# Patient Record
Sex: Male | Born: 1980 | Race: White | Hispanic: No | Marital: Single | Smoking: Current every day smoker
Health system: Southern US, Community
[De-identification: ages and names within clinical notes are randomized; demographics above are authoritative.]

## PROBLEM LIST (undated history)

## (undated) DIAGNOSIS — F4310 Post-traumatic stress disorder, unspecified: Secondary | ICD-10-CM

## (undated) DIAGNOSIS — F988 Other specified behavioral and emotional disorders with onset usually occurring in childhood and adolescence: Secondary | ICD-10-CM

## (undated) DIAGNOSIS — F419 Anxiety disorder, unspecified: Secondary | ICD-10-CM

## (undated) DIAGNOSIS — I10 Essential (primary) hypertension: Secondary | ICD-10-CM

## (undated) DIAGNOSIS — N209 Urinary calculus, unspecified: Secondary | ICD-10-CM

## (undated) HISTORY — PX: LITHOTRIPSY: SUR834

## (undated) HISTORY — PX: OTHER SURGICAL HISTORY: SHX169

---

## 2009-06-21 ENCOUNTER — Emergency Department (HOSPITAL_COMMUNITY): Admission: EM | Admit: 2009-06-21 | Discharge: 2009-06-21 | Payer: Self-pay | Admitting: Emergency Medicine

## 2010-04-24 LAB — BASIC METABOLIC PANEL
BUN: 12 mg/dL (ref 6–23)
Creatinine, Ser: 0.94 mg/dL (ref 0.4–1.5)
Potassium: 4 mEq/L (ref 3.5–5.1)
Sodium: 138 mEq/L (ref 135–145)

## 2010-04-24 LAB — DIFFERENTIAL
Basophils Absolute: 0 10*3/uL (ref 0.0–0.1)
Monocytes Relative: 9 % (ref 3–12)

## 2010-04-24 LAB — CBC
Hemoglobin: 14.5 g/dL (ref 13.0–17.0)
MCV: 95.4 fL (ref 78.0–100.0)
RBC: 4.25 MIL/uL (ref 4.22–5.81)
WBC: 10.3 10*3/uL (ref 4.0–10.5)

## 2010-07-21 ENCOUNTER — Emergency Department (HOSPITAL_COMMUNITY)
Admission: EM | Admit: 2010-07-21 | Discharge: 2010-07-21 | Disposition: A | Discharge status: Home / Self Care | Payer: BC Managed Care – PPO | Attending: Emergency Medicine | Admitting: Emergency Medicine

## 2010-07-21 ENCOUNTER — Emergency Department (HOSPITAL_COMMUNITY): Payer: BC Managed Care – PPO

## 2010-07-21 DIAGNOSIS — I10 Essential (primary) hypertension: Secondary | ICD-10-CM | POA: Insufficient documentation

## 2010-07-21 DIAGNOSIS — R209 Unspecified disturbances of skin sensation: Secondary | ICD-10-CM | POA: Insufficient documentation

## 2010-07-21 DIAGNOSIS — M545 Low back pain, unspecified: Secondary | ICD-10-CM | POA: Insufficient documentation

## 2010-07-21 DIAGNOSIS — M79609 Pain in unspecified limb: Secondary | ICD-10-CM | POA: Insufficient documentation

## 2010-07-21 DIAGNOSIS — R29898 Other symptoms and signs involving the musculoskeletal system: Secondary | ICD-10-CM | POA: Insufficient documentation

## 2011-12-10 ENCOUNTER — Emergency Department (HOSPITAL_COMMUNITY)
Admission: EM | Admit: 2011-12-10 | Discharge: 2011-12-10 | Disposition: A | Discharge status: Home / Self Care | Attending: Emergency Medicine | Admitting: Emergency Medicine

## 2011-12-10 ENCOUNTER — Encounter (HOSPITAL_COMMUNITY): Payer: Self-pay | Admitting: Emergency Medicine

## 2011-12-10 DIAGNOSIS — Z79899 Other long term (current) drug therapy: Secondary | ICD-10-CM | POA: Insufficient documentation

## 2011-12-10 DIAGNOSIS — R51 Headache: Secondary | ICD-10-CM | POA: Insufficient documentation

## 2011-12-10 DIAGNOSIS — F17200 Nicotine dependence, unspecified, uncomplicated: Secondary | ICD-10-CM | POA: Insufficient documentation

## 2011-12-10 DIAGNOSIS — J029 Acute pharyngitis, unspecified: Secondary | ICD-10-CM | POA: Insufficient documentation

## 2011-12-10 DIAGNOSIS — Z8719 Personal history of other diseases of the digestive system: Secondary | ICD-10-CM | POA: Insufficient documentation

## 2011-12-10 DIAGNOSIS — IMO0001 Reserved for inherently not codable concepts without codable children: Secondary | ICD-10-CM | POA: Insufficient documentation

## 2011-12-10 LAB — RAPID STREP SCREEN (MED CTR MEBANE ONLY): Streptococcus, Group A Screen (Direct): POSITIVE — AB

## 2011-12-10 MED ORDER — CLINDAMYCIN HCL 150 MG PO CAPS: 150.0000 mg | ORAL_CAPSULE | Freq: Three times a day (TID) | ORAL | Status: DC

## 2011-12-10 MED ORDER — MELOXICAM 7.5 MG PO TABS: ORAL_TABLET | ORAL | Status: DC

## 2011-12-10 MED ORDER — HYDROCODONE-ACETAMINOPHEN 5-325 MG PO TABS: ORAL_TABLET | ORAL | Status: DC

## 2011-12-10 MED ORDER — HYDROCODONE-ACETAMINOPHEN 5-325 MG PO TABS
ORAL_TABLET | ORAL | Status: AC
  Administered 2011-12-10: 2
  Filled 2011-12-10: qty 2
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
## 2011-12-10 NOTE — ED Notes (Signed)
Pt c/o sore throat x 2-3 days. Exposed to strep throat Friday.

## 2011-12-10 NOTE — ED Notes (Signed)
Sick for 3 days, throat and tonsils red with swelling of tonsils.  Strep screen sent to lab.

## 2011-12-11 NOTE — ED Provider Notes (Signed)
Medical screening examination/treatment/procedure(s) were performed by non-physician practitioner and as supervising physician I was immediately available for consultation/collaboration.   Benny Lennert, MD 12/11/11 [phone]

## 2012-02-25 ENCOUNTER — Emergency Department (HOSPITAL_COMMUNITY)

## 2012-02-25 ENCOUNTER — Encounter (HOSPITAL_COMMUNITY): Payer: Self-pay | Admitting: Emergency Medicine

## 2012-02-25 ENCOUNTER — Emergency Department (HOSPITAL_COMMUNITY)
Admission: EM | Admit: 2012-02-25 | Discharge: 2012-02-25 | Disposition: A | Discharge status: Home / Self Care | Attending: Emergency Medicine | Admitting: Emergency Medicine

## 2012-02-25 DIAGNOSIS — R002 Palpitations: Secondary | ICD-10-CM | POA: Insufficient documentation

## 2012-02-25 DIAGNOSIS — R209 Unspecified disturbances of skin sensation: Secondary | ICD-10-CM | POA: Insufficient documentation

## 2012-02-25 DIAGNOSIS — Z79899 Other long term (current) drug therapy: Secondary | ICD-10-CM | POA: Insufficient documentation

## 2012-02-25 DIAGNOSIS — J3489 Other specified disorders of nose and nasal sinuses: Secondary | ICD-10-CM | POA: Insufficient documentation

## 2012-02-25 DIAGNOSIS — R079 Chest pain, unspecified: Secondary | ICD-10-CM | POA: Insufficient documentation

## 2012-02-25 DIAGNOSIS — I129 Hypertensive chronic kidney disease with stage 1 through stage 4 chronic kidney disease, or unspecified chronic kidney disease: Secondary | ICD-10-CM | POA: Insufficient documentation

## 2012-02-25 DIAGNOSIS — R630 Anorexia: Secondary | ICD-10-CM | POA: Insufficient documentation

## 2012-02-25 DIAGNOSIS — F411 Generalized anxiety disorder: Secondary | ICD-10-CM | POA: Insufficient documentation

## 2012-02-25 DIAGNOSIS — F419 Anxiety disorder, unspecified: Secondary | ICD-10-CM

## 2012-02-25 DIAGNOSIS — H538 Other visual disturbances: Secondary | ICD-10-CM | POA: Insufficient documentation

## 2012-02-25 DIAGNOSIS — R61 Generalized hyperhidrosis: Secondary | ICD-10-CM | POA: Insufficient documentation

## 2012-02-25 DIAGNOSIS — F17200 Nicotine dependence, unspecified, uncomplicated: Secondary | ICD-10-CM | POA: Insufficient documentation

## 2012-02-25 DIAGNOSIS — R05 Cough: Secondary | ICD-10-CM | POA: Insufficient documentation

## 2012-02-25 DIAGNOSIS — R059 Cough, unspecified: Secondary | ICD-10-CM | POA: Insufficient documentation

## 2012-02-25 DIAGNOSIS — N289 Disorder of kidney and ureter, unspecified: Secondary | ICD-10-CM | POA: Insufficient documentation

## 2012-02-25 DIAGNOSIS — R112 Nausea with vomiting, unspecified: Secondary | ICD-10-CM | POA: Insufficient documentation

## 2012-02-25 DIAGNOSIS — R55 Syncope and collapse: Secondary | ICD-10-CM | POA: Insufficient documentation

## 2012-02-25 HISTORY — DX: Essential (primary) hypertension: I10

## 2012-02-25 LAB — CBC
HCT: 42.4 % (ref 39.0–52.0)
MCH: 33 pg (ref 26.0–34.0)
MCHC: 35.4 g/dL (ref 30.0–36.0)
MCV: 93.4 fL (ref 78.0–100.0)
Platelets: 287 10*3/uL (ref 150–400)
RBC: 4.54 MIL/uL (ref 4.22–5.81)
RDW: 13 % (ref 11.5–15.5)

## 2012-02-25 LAB — TROPONIN I: Troponin I: <0.3 ng/mL (ref <0.30)

## 2012-02-25 LAB — COMPREHENSIVE METABOLIC PANEL
BUN: 9 mg/dL (ref 6–23)
Calcium: 9.9 mg/dL (ref 8.4–10.5)
GFR calc non Af Amer: >90 mL/min (ref >90)
Glucose, Bld: 99 mg/dL (ref 70–99)
Potassium: 3.4 mEq/L — ABNORMAL LOW (ref 3.5–5.1)
Sodium: 138 mEq/L (ref 135–145)
Total Bilirubin: 0.9 mg/dL (ref 0.3–1.2)

## 2012-02-25 LAB — RAPID URINE DRUG SCREEN, HOSP PERFORMED
Amphetamines: NOT DETECTED
Benzodiazepines: NOT DETECTED
Cocaine: NOT DETECTED
Opiates: POSITIVE — AB
Tetrahydrocannabinol: NOT DETECTED

## 2012-02-25 LAB — URINALYSIS, ROUTINE W REFLEX MICROSCOPIC
Glucose, UA: NEGATIVE mg/dL
Hgb urine dipstick: NEGATIVE
Leukocytes, UA: NEGATIVE
Specific Gravity, Urine: 1.015 (ref 1.005–1.030)
pH: 8.5 — ABNORMAL HIGH (ref 5.0–8.0)

## 2012-02-25 LAB — D-DIMER, QUANTITATIVE: D-Dimer, Quant: <0.27 ug/mL-FEU (ref 0.00–0.48)

## 2012-02-25 MED ORDER — HYDROCODONE-ACETAMINOPHEN 5-325 MG PO TABS: 1.0000 | ORAL_TABLET | Freq: Four times a day (QID) | ORAL | Status: AC | PRN

## 2012-02-25 MED ORDER — ONDANSETRON HCL 4 MG/2ML IJ SOLN
4.0000 mg | Freq: Once | INTRAMUSCULAR | Status: AC
  Administered 2012-02-25: 4 mg via INTRAVENOUS
  Filled 2012-02-25: qty 2

## 2012-02-25 MED ORDER — LORAZEPAM 1 MG PO TABS: 1.0000 mg | ORAL_TABLET | Freq: Three times a day (TID) | ORAL | Status: DC | PRN

## 2012-02-25 MED ORDER — LORAZEPAM 2 MG/ML IJ SOLN
1.0000 mg | Freq: Once | INTRAMUSCULAR | Status: AC
  Administered 2012-02-25: 1 mg via INTRAVENOUS
  Filled 2012-02-25: qty 1

## 2012-02-25 MED ORDER — SODIUM CHLORIDE 0.9 % IV BOLUS (SEPSIS)
1000.0000 mL | Freq: Once | INTRAVENOUS | Status: AC
  Administered 2012-02-25: 1000 mL via INTRAVENOUS

## 2012-02-25 MED ORDER — SODIUM CHLORIDE 0.9 % IV SOLN
INTRAVENOUS | Status: DC
  Administered 2012-02-25 (×2): 1000 mL via INTRAVENOUS

## 2012-02-25 MED ORDER — HYDROMORPHONE HCL PF 1 MG/ML IJ SOLN
1.0000 mg | Freq: Once | INTRAMUSCULAR | Status: AC
  Administered 2012-02-25: 1 mg via INTRAVENOUS
  Filled 2012-02-25: qty 1

## 2012-02-25 MED ORDER — MORPHINE SULFATE 2 MG/ML IJ SOLN
2.0000 mg | Freq: Once | INTRAMUSCULAR | Status: AC
  Administered 2012-02-25: 2 mg via INTRAVENOUS
  Filled 2012-02-25: qty 1

## 2012-02-25 MED ORDER — IOHEXOL 350 MG/ML SOLN
100.0000 mL | Freq: Once | INTRAVENOUS | Status: AC | PRN
  Administered 2012-02-25: 100 mL via INTRAVENOUS

## 2012-02-25 MED ORDER — ASPIRIN 81 MG PO CHEW
324.0000 mg | CHEWABLE_TABLET | Freq: Once | ORAL | Status: AC
  Administered 2012-02-25: 324 mg via ORAL
  Filled 2012-02-25: qty 4

## 2012-02-25 MED ORDER — NITROGLYCERIN 0.4 MG SL SUBL
0.4000 mg | SUBLINGUAL_TABLET | SUBLINGUAL | Status: DC | PRN
  Administered 2012-02-25 (×2): 0.4 mg via SUBLINGUAL
  Filled 2012-02-25: qty 25
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
## 2012-02-25 NOTE — ED Notes (Signed)
C/o left sided cp that began early this am along with vomiting. Pain radiates down left arm and c/o left arm numbness as well.

## 2013-04-01 ENCOUNTER — Ambulatory Visit (INDEPENDENT_AMBULATORY_CARE_PROVIDER_SITE_OTHER): Payer: 59 | Admitting: Family Medicine

## 2013-04-01 ENCOUNTER — Ambulatory Visit: Payer: 59

## 2013-04-01 VITALS — BP 116/72 | HR 99 | Temp 98.0°F | Resp 16

## 2013-04-01 DIAGNOSIS — S62308A Unspecified fracture of other metacarpal bone, initial encounter for closed fracture: Secondary | ICD-10-CM

## 2013-04-01 DIAGNOSIS — M79641 Pain in right hand: Secondary | ICD-10-CM

## 2013-04-01 DIAGNOSIS — M79609 Pain in unspecified limb: Secondary | ICD-10-CM

## 2013-04-01 NOTE — Patient Instructions (Signed)
See Dr. Mack Hookavid [facility]  Do not eat or drink anything until seen  You've declined putting on the splint here.Please be careful not to fall or hit it.

## 2013-04-01 NOTE — Progress Notes (Signed)
Subjective: 33 year old man slipped on some ice this morning and landed hard on his hand which was folded back under him. He got an abrasion of the third knuckle, but mostly it hurts across the whole hand. More laterally. No prior problems.  Objective: Very tender dorsum of the left hand from the second to the fifth better carpal regions, especially the more proximal fifth which is swollen. He has a small abrasion of the knuckle of the third finger. He says he has tingling in her finger tips. Minimal motion possible but it hurts too much to move much. He cannot make a fist. He is tender to the wrist.  [facility] reading (PRIMARY) by  Dr. Alwyn RenHopper Mildly displaced and moderately angulated closed fracture of right 5th metacarpal, proximal  Assessment: Hand pain Closed fracture right fifth metacarpal, proximal, probably angulated 30-40 and mildly displaced. A little bit difficult to visualize exactly..  Plan: [facility]

## 2014-03-16 ENCOUNTER — Encounter (HOSPITAL_COMMUNITY): Payer: Self-pay | Admitting: Emergency Medicine

## 2014-03-16 DIAGNOSIS — Z79899 Other long term (current) drug therapy: Secondary | ICD-10-CM | POA: Diagnosis not present

## 2014-03-16 DIAGNOSIS — Z87448 Personal history of other diseases of urinary system: Secondary | ICD-10-CM | POA: Insufficient documentation

## 2014-03-16 DIAGNOSIS — Z880 Allergy status to penicillin: Secondary | ICD-10-CM | POA: Diagnosis not present

## 2014-03-16 DIAGNOSIS — Z720 Tobacco use: Secondary | ICD-10-CM | POA: Insufficient documentation

## 2014-03-16 DIAGNOSIS — I10 Essential (primary) hypertension: Secondary | ICD-10-CM | POA: Insufficient documentation

## 2014-03-16 DIAGNOSIS — F419 Anxiety disorder, unspecified: Secondary | ICD-10-CM | POA: Insufficient documentation

## 2014-03-16 DIAGNOSIS — G4700 Insomnia, unspecified: Secondary | ICD-10-CM | POA: Diagnosis not present

## 2014-03-16 NOTE — ED Notes (Addendum)
Pt. reports anxiety , insomnia for several days , emotional stress  and visual hallucinations onset this week , denies suicidal ideation .

## 2014-03-17 ENCOUNTER — Emergency Department (HOSPITAL_COMMUNITY)
Admission: EM | Admit: 2014-03-17 | Discharge: 2014-03-17 | Disposition: A | Discharge status: Home / Self Care | Payer: Non-veteran care | Attending: Emergency Medicine | Admitting: Emergency Medicine

## 2014-03-17 DIAGNOSIS — G4700 Insomnia, unspecified: Secondary | ICD-10-CM

## 2014-03-17 DIAGNOSIS — F419 Anxiety disorder, unspecified: Secondary | ICD-10-CM

## 2014-03-17 HISTORY — DX: Anxiety disorder, unspecified: F41.9

## 2014-03-17 MED ORDER — LORAZEPAM 1 MG PO TABS
2.0000 mg | ORAL_TABLET | Freq: Once | ORAL | Status: AC
  Administered 2014-03-17: 2 mg via ORAL
  Filled 2014-03-17: qty 2

## 2014-03-17 NOTE — Discharge Instructions (Signed)
Ativan as prescribed as needed for anxiety.  Follow-up with your primary Dr. in the next week to be rechecked.   Insomnia Insomnia is frequent trouble falling and/or staying asleep. Insomnia can be a long term problem or a short term problem. Both are common. Insomnia can be a short term problem when the wakefulness is related to a certain stress or worry. Long term insomnia is often related to ongoing stress during waking hours and/or poor sleeping habits. Overtime, sleep deprivation itself can make the problem worse. Every little thing feels more severe because you are overtired and your ability to cope is decreased. CAUSES   Stress, anxiety, and depression.  Poor sleeping habits.  Distractions such as TV in the bedroom.  Naps close to bedtime.  Engaging in emotionally charged conversations before bed.  Technical reading before sleep.  Alcohol and other sedatives. They may make the problem worse. They can hurt normal sleep patterns and normal dream activity.  Stimulants such as caffeine for several hours prior to bedtime.  Pain syndromes and shortness of breath can cause insomnia.  Exercise late at night.  Changing time zones may cause sleeping problems (jet lag). It is sometimes helpful to have someone observe your sleeping patterns. They should look for periods of not breathing during the night (sleep apnea). They should also look to see how long those periods last. If you live alone or observers are uncertain, you can also be observed at a sleep clinic where your sleep patterns will be professionally monitored. Sleep apnea requires a checkup and treatment. Give your caregivers your medical history. Give your caregivers observations your family has made about your sleep.  SYMPTOMS   Not feeling rested in the morning.  Anxiety and restlessness at bedtime.  Difficulty falling and staying asleep. TREATMENT   Your caregiver may prescribe treatment for an underlying medical  disorders. Your caregiver can give advice or help if you are using alcohol or other drugs for self-medication. Treatment of underlying problems will usually eliminate insomnia problems.  Medications can be prescribed for short time use. They are generally not recommended for lengthy use.  Over-the-counter sleep medicines are not recommended for lengthy use. They can be habit forming.  You can promote easier sleeping by making lifestyle changes such as:  Using relaxation techniques that help with breathing and reduce muscle tension.  Exercising earlier in the day.  Changing your diet and the time of your last meal. No night time snacks.  Establish a regular time to go to bed.  Counseling can help with stressful problems and worry.  Soothing music and white noise may be helpful if there are background noises you cannot remove.  Stop tedious detailed work at least one hour before bedtime. HOME CARE INSTRUCTIONS   Keep a diary. Inform your caregiver about your progress. This includes any medication side effects. See your caregiver regularly. Take note of:  Times when you are asleep.  Times when you are awake during the night.  The quality of your sleep.  How you feel the next day. This information will help your caregiver care for you.  Get out of bed if you are still awake after 15 minutes. Read or do some quiet activity. Keep the lights down. Wait until you feel sleepy and go back to bed.  Keep regular sleeping and waking hours. Avoid naps.  Exercise regularly.  Avoid distractions at bedtime. Distractions include watching television or engaging in any intense or detailed activity like attempting to balance the  household checkbook.  Develop a bedtime ritual. Keep a familiar routine of bathing, brushing your teeth, climbing into bed at the same time each night, listening to soothing music. Routines increase the success of falling to sleep faster.  Use relaxation techniques.  This can be using breathing and muscle tension release routines. It can also include visualizing peaceful scenes. You can also help control troubling or intruding thoughts by keeping your mind occupied with boring or repetitive thoughts like the old concept of counting sheep. You can make it more creative like imagining planting one beautiful flower after another in your backyard garden.  During your day, work to eliminate stress. When this is not possible use some of the previous suggestions to help reduce the anxiety that accompanies stressful situations. MAKE SURE YOU:   Understand these instructions.  Will watch your condition.  Will get help right away if you are not doing well or get worse. Document Released: 01/20/2000 Document Revised: 04/16/2011 Document Reviewed: 02/19/2007 [facility] Patient Information 2015 [place]. This information is not intended to replace advice given to you by your health care provider. Make sure you discuss any questions you have with your health care provider.

## 2014-03-17 NOTE — ED Notes (Addendum)
Pt reports having PTSD, sx get worse when the month of March comes around. Pt states he has not slept in 3 days, hasn't eaten, and states he sees things in his sleep. Pt requesting something to help him sleep, states he is supposed to go to a facility for veterans with PTSD sometime this week but needs something to help sx til then. Dr. Judd Lienelo at bedside.

## 2014-03-17 NOTE — ED Notes (Signed)
Discharge instructions reviewed with pt and significant other. Pt verbalized understanding.  
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
## 2014-04-20 ENCOUNTER — Emergency Department (HOSPITAL_COMMUNITY)
Admission: EM | Admit: 2014-04-20 | Discharge: 2014-04-21 | Disposition: A | Discharge status: Home / Self Care | Payer: Non-veteran care | Attending: Emergency Medicine | Admitting: Emergency Medicine

## 2014-04-20 ENCOUNTER — Encounter (HOSPITAL_COMMUNITY): Payer: Self-pay | Admitting: *Deleted

## 2014-04-20 DIAGNOSIS — F419 Anxiety disorder, unspecified: Secondary | ICD-10-CM | POA: Insufficient documentation

## 2014-04-20 DIAGNOSIS — R61 Generalized hyperhidrosis: Secondary | ICD-10-CM | POA: Insufficient documentation

## 2014-04-20 DIAGNOSIS — Z720 Tobacco use: Secondary | ICD-10-CM | POA: Diagnosis not present

## 2014-04-20 DIAGNOSIS — I10 Essential (primary) hypertension: Secondary | ICD-10-CM | POA: Diagnosis not present

## 2014-04-20 DIAGNOSIS — Z87448 Personal history of other diseases of urinary system: Secondary | ICD-10-CM | POA: Diagnosis not present

## 2014-04-20 DIAGNOSIS — Z79899 Other long term (current) drug therapy: Secondary | ICD-10-CM | POA: Diagnosis not present

## 2014-04-20 DIAGNOSIS — T426X5A Adverse effect of other antiepileptic and sedative-hypnotic drugs, initial encounter: Secondary | ICD-10-CM | POA: Diagnosis not present

## 2014-04-20 DIAGNOSIS — R3911 Hesitancy of micturition: Secondary | ICD-10-CM | POA: Insufficient documentation

## 2014-04-20 DIAGNOSIS — Z880 Allergy status to penicillin: Secondary | ICD-10-CM | POA: Insufficient documentation

## 2014-04-20 DIAGNOSIS — T50905A Adverse effect of unspecified drugs, medicaments and biological substances, initial encounter: Secondary | ICD-10-CM

## 2014-04-20 DIAGNOSIS — R339 Retention of urine, unspecified: Secondary | ICD-10-CM | POA: Diagnosis present

## 2014-04-20 MED ORDER — LORAZEPAM 1 MG PO TABS
2.0000 mg | ORAL_TABLET | Freq: Once | ORAL | Status: AC
Start: 2014-04-21 — End: 2014-04-20
  Administered 2014-04-20: 2 mg via ORAL
  Filled 2014-04-20: qty 2

## 2014-04-20 NOTE — ED Notes (Signed)
Pt reports that [facility] ago.  States since that time he has been having difficulty with urination, dizziness, SOB.

## 2014-04-20 NOTE — ED Notes (Signed)
Pt states last time he voided was last night around 10:30pm.
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
## 2014-04-21 LAB — CBC
HEMATOCRIT: 41.9 % (ref 39.0–52.0)
Hemoglobin: 14 g/dL (ref 13.0–17.0)
MCH: 32.6 pg (ref 26.0–34.0)
MCHC: 33.4 g/dL (ref 30.0–36.0)
MCV: 97.7 fL (ref 78.0–100.0)
PLATELETS: 306 10*3/uL (ref 150–400)
RBC: 4.29 MIL/uL (ref 4.22–5.81)
RDW: 14 % (ref 11.5–15.5)
WBC: 9 10*3/uL (ref 4.0–10.5)

## 2014-04-21 LAB — URINALYSIS, ROUTINE W REFLEX MICROSCOPIC
BILIRUBIN URINE: NEGATIVE
Glucose, UA: NEGATIVE mg/dL
HGB URINE DIPSTICK: NEGATIVE
KETONES UR: NEGATIVE mg/dL
LEUKOCYTES UA: NEGATIVE
Nitrite: NEGATIVE
Protein, ur: NEGATIVE mg/dL
SPECIFIC GRAVITY, URINE: 1.01 (ref 1.005–1.030)
Urobilinogen, UA: 0.2 mg/dL (ref 0.0–1.0)
pH: 6.5 (ref 5.0–8.0)

## 2014-04-21 LAB — BASIC METABOLIC PANEL
ANION GAP: 5 (ref 5–15)
BUN: 9 mg/dL (ref 6–23)
CALCIUM: 8.5 mg/dL (ref 8.4–10.5)
CHLORIDE: 107 mmol/L (ref 96–112)
CO2: 27 mmol/L (ref 19–32)
CREATININE: 0.91 mg/dL (ref 0.50–1.35)
GFR calc Af Amer: >90 mL/min (ref >90)
Glucose, Bld: 105 mg/dL — ABNORMAL HIGH (ref 70–99)
POTASSIUM: 3.8 mmol/L (ref 3.5–5.1)
SODIUM: 139 mmol/L (ref 135–145)

## 2014-04-21 MED ORDER — TAMSULOSIN HCL 0.4 MG PO CAPS: 0.4000 mg | ORAL_CAPSULE | Freq: Every day | ORAL | Status: DC

## 2014-04-21 MED ORDER — ZOLPIDEM TARTRATE 5 MG PO TABS
5.0000 mg | ORAL_TABLET | Freq: Once | ORAL | Status: AC
  Administered 2014-04-21: 5 mg via ORAL
  Filled 2014-04-21: qty 1

## 2014-04-21 MED ORDER — PHENAZOPYRIDINE HCL 100 MG PO TABS
200.0000 mg | ORAL_TABLET | Freq: Once | ORAL | Status: AC
  Administered 2014-04-21: 200 mg via ORAL
  Filled 2014-04-21: qty 2

## 2014-04-21 MED ORDER — ZOLPIDEM TARTRATE 5 MG PO TABS: 5.0000 mg | ORAL_TABLET | Freq: Every evening | ORAL | Status: DC | PRN

## 2014-04-22 ENCOUNTER — Encounter (HOSPITAL_COMMUNITY): Payer: Self-pay | Admitting: Emergency Medicine

## 2014-04-22 ENCOUNTER — Emergency Department (HOSPITAL_COMMUNITY)
Admission: EM | Admit: 2014-04-22 | Discharge: 2014-04-22 | Disposition: A | Discharge status: Home / Self Care | Payer: Non-veteran care | Attending: Emergency Medicine | Admitting: Emergency Medicine

## 2014-04-22 DIAGNOSIS — R000 Tachycardia, unspecified: Secondary | ICD-10-CM | POA: Diagnosis not present

## 2014-04-22 DIAGNOSIS — I10 Essential (primary) hypertension: Secondary | ICD-10-CM | POA: Diagnosis not present

## 2014-04-22 DIAGNOSIS — R42 Dizziness and giddiness: Secondary | ICD-10-CM | POA: Diagnosis present

## 2014-04-22 DIAGNOSIS — Z79899 Other long term (current) drug therapy: Secondary | ICD-10-CM | POA: Insufficient documentation

## 2014-04-22 DIAGNOSIS — F419 Anxiety disorder, unspecified: Secondary | ICD-10-CM | POA: Insufficient documentation

## 2014-04-22 DIAGNOSIS — Z880 Allergy status to penicillin: Secondary | ICD-10-CM | POA: Diagnosis not present

## 2014-04-22 DIAGNOSIS — Z87448 Personal history of other diseases of urinary system: Secondary | ICD-10-CM | POA: Diagnosis not present

## 2014-04-22 DIAGNOSIS — Z720 Tobacco use: Secondary | ICD-10-CM | POA: Diagnosis not present

## 2014-04-22 MED ORDER — ZIPRASIDONE MESYLATE 20 MG IM SOLR
10.0000 mg | Freq: Once | INTRAMUSCULAR | Status: AC
Start: 2014-04-22 — End: 2014-04-22
  Administered 2014-04-22: 10 mg via INTRAMUSCULAR
  Filled 2014-04-22: qty 20

## 2014-04-22 MED ORDER — LORAZEPAM 1 MG PO TABS: 1.0000 mg | ORAL_TABLET | Freq: Three times a day (TID) | ORAL | Status: DC | PRN

## 2014-04-22 MED ORDER — STERILE WATER FOR INJECTION IJ SOLN
INTRAMUSCULAR | Status: AC
  Administered 2014-04-22: 1.2 mL
  Filled 2014-04-22: qty 10

## 2014-04-22 NOTE — ED Notes (Signed)
Shaking has subsided at present.

## 2014-04-22 NOTE — Discharge Instructions (Signed)
Follow up with your md next week. Stop the lamotrigine

## 2014-04-22 NOTE — ED Notes (Signed)
Pt was started on new meds and now he can not quit moving his legs and he is really dizzy. Pt states he has fallen multiple times and he was seen for the same here this week.
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
## 2014-04-22 NOTE — ED Notes (Addendum)
I was trying to get the patient to ride in the wheel chair because he is unsteady of walking to room 14. The patient was refusing to ride in the wheel chair to go to the room. As we were walking to room 14 the patient kept saying that he can not see but he was looking at the squares on the floor as he was walking. I tried to get the patient to laying down on the bed and I would raise the head of the bed up since he was stating that he was really dizzy. The patient was refusing of laying down on the bed and that he was going to sit on the side of the bed. The RN has be notified.

## 2014-04-22 NOTE — ED Notes (Signed)
MD at bedside. 
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
## 2014-09-03 ENCOUNTER — Encounter: Payer: Self-pay | Admitting: Family Medicine

## 2015-03-21 ENCOUNTER — Encounter: Payer: Self-pay | Admitting: Family Medicine

## 2015-03-22 ENCOUNTER — Encounter: Payer: Self-pay | Admitting: Family Medicine

## 2016-03-02 ENCOUNTER — Encounter (HOSPITAL_COMMUNITY): Payer: Self-pay | Admitting: *Deleted

## 2016-03-02 ENCOUNTER — Emergency Department (HOSPITAL_COMMUNITY): Payer: Non-veteran care

## 2016-03-02 ENCOUNTER — Emergency Department (HOSPITAL_COMMUNITY)
Admission: EM | Admit: 2016-03-02 | Discharge: 2016-03-03 | Disposition: A | Discharge status: Home / Self Care | Payer: Non-veteran care | Attending: Emergency Medicine | Admitting: Emergency Medicine

## 2016-03-02 DIAGNOSIS — I10 Essential (primary) hypertension: Secondary | ICD-10-CM | POA: Diagnosis not present

## 2016-03-02 DIAGNOSIS — K921 Melena: Secondary | ICD-10-CM | POA: Diagnosis not present

## 2016-03-02 DIAGNOSIS — F17210 Nicotine dependence, cigarettes, uncomplicated: Secondary | ICD-10-CM | POA: Diagnosis not present

## 2016-03-02 DIAGNOSIS — R140 Abdominal distension (gaseous): Secondary | ICD-10-CM | POA: Diagnosis not present

## 2016-03-02 DIAGNOSIS — R1030 Lower abdominal pain, unspecified: Secondary | ICD-10-CM | POA: Insufficient documentation

## 2016-03-02 DIAGNOSIS — Z79899 Other long term (current) drug therapy: Secondary | ICD-10-CM | POA: Diagnosis not present

## 2016-03-02 DIAGNOSIS — R112 Nausea with vomiting, unspecified: Secondary | ICD-10-CM | POA: Diagnosis not present

## 2016-03-02 DIAGNOSIS — R1084 Generalized abdominal pain: Secondary | ICD-10-CM | POA: Diagnosis present

## 2016-03-02 LAB — COMPREHENSIVE METABOLIC PANEL WITH GFR
ALT: 27 U/L (ref 17–63)
AST: 24 U/L (ref 15–41)
Albumin: 4 g/dL (ref 3.5–5.0)
Alkaline Phosphatase: 96 U/L (ref 38–126)
Anion gap: 8 (ref 5–15)
BUN: 14 mg/dL (ref 6–20)
CO2: 30 mmol/L (ref 22–32)
Calcium: 9.2 mg/dL (ref 8.9–10.3)
Chloride: 98 mmol/L — ABNORMAL LOW (ref 101–111)
Creatinine, Ser: 1.11 mg/dL (ref 0.61–1.24)
GFR calc Af Amer: >60 mL/min
GFR calc non Af Amer: >60 mL/min
Glucose, Bld: 103 mg/dL — ABNORMAL HIGH (ref 65–99)
Potassium: 4.1 mmol/L (ref 3.5–5.1)
Sodium: 136 mmol/L (ref 135–145)
Total Bilirubin: 0.4 mg/dL (ref 0.3–1.2)
Total Protein: 7.7 g/dL (ref 6.5–8.1)

## 2016-03-02 LAB — CBC
HCT: 42.8 % (ref 39.0–52.0)
Hemoglobin: 14.4 g/dL (ref 13.0–17.0)
MCH: 32.4 pg (ref 26.0–34.0)
MCHC: 33.6 g/dL (ref 30.0–36.0)
MCV: 96.4 fL (ref 78.0–100.0)
Platelets: 305 10*3/uL (ref 150–400)
RBC: 4.44 MIL/uL (ref 4.22–5.81)
RDW: 13.5 % (ref 11.5–15.5)
WBC: 9.5 10*3/uL (ref 4.0–10.5)

## 2016-03-02 LAB — LIPASE, BLOOD: Lipase: 12 U/L (ref 11–51)

## 2016-03-02 LAB — TYPE AND SCREEN
ABO/RH(D): AB POS
Antibody Screen: NEGATIVE

## 2016-03-02 MED ORDER — PANTOPRAZOLE SODIUM 40 MG IV SOLR
40.0000 mg | Freq: Once | INTRAVENOUS | Status: AC
  Administered 2016-03-02: 40 mg via INTRAVENOUS
  Filled 2016-03-02: qty 40

## 2016-03-02 MED ORDER — IOPAMIDOL (ISOVUE-300) INJECTION 61%: 30.0000 mL | Freq: Once | INTRAVENOUS | Status: DC | PRN

## 2016-03-02 MED ORDER — SODIUM CHLORIDE 0.9 % IV BOLUS (SEPSIS)
1000.0000 mL | Freq: Once | INTRAVENOUS | Status: AC
  Administered 2016-03-02: 1000 mL via INTRAVENOUS

## 2016-03-02 MED ORDER — IOPAMIDOL (ISOVUE-300) INJECTION 61%: 100.0000 mL | Freq: Once | INTRAVENOUS | Status: DC | PRN

## 2016-03-02 MED ORDER — IOPAMIDOL (ISOVUE-300) INJECTION 61%
INTRAVENOUS | Status: AC
  Filled 2016-03-02: qty 30

## 2016-03-02 NOTE — ED Triage Notes (Signed)
Pt comes in for abdominal distention starting 2 days ago. Pt has had dark coffee ground stools. Pt has been having vomiting as well. Denies blood in his emesis. NAD noted.
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
## 2016-03-03 MED ORDER — ONDANSETRON 4 MG PO TBDP: ORAL_TABLET | ORAL | 0 refills | Status: DC

## 2016-03-03 MED ORDER — HYDROMORPHONE HCL 1 MG/ML IJ SOLN
1.0000 mg | Freq: Once | INTRAMUSCULAR | Status: AC
  Administered 2016-03-03: 1 mg via INTRAVENOUS
  Filled 2016-03-03: qty 1

## 2016-03-03 MED ORDER — ONDANSETRON HCL 4 MG/2ML IJ SOLN
4.0000 mg | Freq: Once | INTRAMUSCULAR | Status: AC
  Administered 2016-03-03: 4 mg via INTRAVENOUS
  Filled 2016-03-03: qty 2

## 2016-03-03 MED ORDER — DICYCLOMINE HCL 20 MG PO TABS: ORAL_TABLET | ORAL | 0 refills | Status: DC

## 2016-03-03 NOTE — ED Notes (Signed)
Informed edp that pt is requesting pain medication

## 2016-03-03 NOTE — Discharge Instructions (Signed)
Drink plenty of fluids. Follow-up next week for recheck. Follow-up with your doctor or follow-up with Dr.Rourk.  Return if getting worse

## 2016-03-03 NOTE — ED Notes (Signed)
Informed edp that pt needed pain medication

## 2016-08-21 ENCOUNTER — Ambulatory Visit: Payer: Self-pay | Admitting: Family Medicine

## 2016-08-28 ENCOUNTER — Encounter (HOSPITAL_COMMUNITY): Payer: Self-pay | Admitting: Emergency Medicine

## 2016-08-28 ENCOUNTER — Emergency Department (HOSPITAL_COMMUNITY): Payer: Non-veteran care

## 2016-08-28 ENCOUNTER — Emergency Department (HOSPITAL_COMMUNITY)
Admission: EM | Admit: 2016-08-28 | Discharge: 2016-08-28 | Disposition: A | Discharge status: Home / Self Care | Payer: Non-veteran care | Attending: Emergency Medicine | Admitting: Emergency Medicine

## 2016-08-28 DIAGNOSIS — Z79899 Other long term (current) drug therapy: Secondary | ICD-10-CM | POA: Diagnosis not present

## 2016-08-28 DIAGNOSIS — W228XXA Striking against or struck by other objects, initial encounter: Secondary | ICD-10-CM | POA: Insufficient documentation

## 2016-08-28 DIAGNOSIS — Y939 Activity, unspecified: Secondary | ICD-10-CM | POA: Diagnosis not present

## 2016-08-28 DIAGNOSIS — I10 Essential (primary) hypertension: Secondary | ICD-10-CM | POA: Insufficient documentation

## 2016-08-28 DIAGNOSIS — Y929 Unspecified place or not applicable: Secondary | ICD-10-CM | POA: Insufficient documentation

## 2016-08-28 DIAGNOSIS — S51812A Laceration without foreign body of left forearm, initial encounter: Secondary | ICD-10-CM | POA: Insufficient documentation

## 2016-08-28 DIAGNOSIS — S59912A Unspecified injury of left forearm, initial encounter: Secondary | ICD-10-CM | POA: Diagnosis present

## 2016-08-28 DIAGNOSIS — Y999 Unspecified external cause status: Secondary | ICD-10-CM | POA: Diagnosis not present

## 2016-08-28 DIAGNOSIS — J069 Acute upper respiratory infection, unspecified: Secondary | ICD-10-CM

## 2016-08-28 DIAGNOSIS — F17210 Nicotine dependence, cigarettes, uncomplicated: Secondary | ICD-10-CM | POA: Insufficient documentation

## 2016-08-28 MED ORDER — BENZONATATE 100 MG PO CAPS
200.0000 mg | ORAL_CAPSULE | Freq: Once | ORAL | Status: AC
  Administered 2016-08-28: 200 mg via ORAL
  Filled 2016-08-28: qty 2

## 2016-08-28 MED ORDER — LIDOCAINE HCL (PF) 1 % IJ SOLN
5.0000 mL | Freq: Once | INTRAMUSCULAR | Status: AC
  Administered 2016-08-28: 5 mL
  Filled 2016-08-28: qty 5

## 2016-08-28 MED ORDER — POVIDONE-IODINE 10 % EX SOLN
CUTANEOUS | Status: AC
  Administered 2016-08-28: 22:00:00
  Filled 2016-08-28: qty 15

## 2016-08-28 MED ORDER — BENZONATATE 100 MG PO CAPS: 200.0000 mg | ORAL_CAPSULE | Freq: Three times a day (TID) | ORAL | 0 refills | Status: DC | PRN

## 2016-08-28 MED ORDER — SULFAMETHOXAZOLE-TRIMETHOPRIM 800-160 MG PO TABS: 1.0000 | ORAL_TABLET | Freq: Two times a day (BID) | ORAL | 0 refills | Status: AC

## 2016-08-28 NOTE — ED Notes (Signed)
Laceration to the left wrist about 7 cm. Pt states he cut it on a piece of plastic from a car. Pt also c/o of cough that has been productive at times. Dry cough at present.

## 2016-08-28 NOTE — ED Notes (Signed)
Pt alert & oriented x4, stable gait. Patient given discharge instructions, paperwork & prescription(s). Patient  instructed to stop at the registration desk to finish any additional paperwork. Patient verbalized understanding. Pt left department w/ no further questions. 

## 2016-08-28 NOTE — ED Triage Notes (Signed)
Pt c/o left wrist laceration from car door and cough.

## 2016-08-28 NOTE — Discharge Instructions (Signed)
Have your sutures removed in 12 days.  Keep your wound clean and dry,  Until a good scab forms - you may then wash gently twice daily with mild soap and water, but dry completely after.  Get rechecked for any sign of infection (redness,  Swelling,  Increased pain or drainage of purulent fluid).
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
## 2017-02-01 ENCOUNTER — Emergency Department (HOSPITAL_COMMUNITY)
Admission: EM | Admit: 2017-02-01 | Discharge: 2017-02-01 | Disposition: A | Discharge status: Home / Self Care | Payer: Non-veteran care | Attending: Emergency Medicine | Admitting: Emergency Medicine

## 2017-02-01 ENCOUNTER — Other Ambulatory Visit: Payer: Self-pay

## 2017-02-01 ENCOUNTER — Encounter (HOSPITAL_COMMUNITY): Payer: Self-pay | Admitting: Emergency Medicine

## 2017-02-01 ENCOUNTER — Emergency Department (HOSPITAL_COMMUNITY): Payer: Non-veteran care

## 2017-02-01 DIAGNOSIS — Z79899 Other long term (current) drug therapy: Secondary | ICD-10-CM | POA: Insufficient documentation

## 2017-02-01 DIAGNOSIS — I10 Essential (primary) hypertension: Secondary | ICD-10-CM | POA: Insufficient documentation

## 2017-02-01 DIAGNOSIS — F17210 Nicotine dependence, cigarettes, uncomplicated: Secondary | ICD-10-CM | POA: Diagnosis not present

## 2017-02-01 DIAGNOSIS — M5441 Lumbago with sciatica, right side: Secondary | ICD-10-CM | POA: Diagnosis present

## 2017-02-01 MED ORDER — OXYCODONE-ACETAMINOPHEN 5-325 MG PO TABS: 1.0000 | ORAL_TABLET | ORAL | 0 refills | Status: DC | PRN

## 2017-02-01 MED ORDER — PREDNISONE 10 MG PO TABS: ORAL_TABLET | ORAL | 0 refills | Status: DC

## 2017-02-01 MED ORDER — HYDROMORPHONE HCL 1 MG/ML IJ SOLN
1.0000 mg | Freq: Once | INTRAMUSCULAR | Status: AC
  Administered 2017-02-01: 1 mg via INTRAMUSCULAR
  Filled 2017-02-01: qty 1

## 2017-02-01 MED ORDER — CYCLOBENZAPRINE HCL 10 MG PO TABS
10.0000 mg | ORAL_TABLET | Freq: Once | ORAL | Status: AC
  Administered 2017-02-01: 10 mg via ORAL
  Filled 2017-02-01: qty 1

## 2017-02-01 MED ORDER — KETOROLAC TROMETHAMINE 60 MG/2ML IM SOLN
60.0000 mg | Freq: Once | INTRAMUSCULAR | Status: AC
  Administered 2017-02-01: 60 mg via INTRAMUSCULAR
  Filled 2017-02-01: qty 2

## 2017-02-01 NOTE — ED Triage Notes (Signed)
2 month history of back pain unrelieved by meds

## 2017-02-01 NOTE — Discharge Instructions (Signed)
Apply ice packs on and off to your back.  [facility]  Return to the ER for any worsening symptoms.  You may want to take a over-the-counter stool softener such as Colace while taking the Percocet.

## 2017-02-01 NOTE — ED Notes (Signed)
Pt appears anxious, reports he doesn't want to be here except his SO made him come  He ambulates heel to toe without stagger or drift Is conversant

## 2017-02-01 NOTE — ED Notes (Signed)
From CT 
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
## 2017-03-02 ENCOUNTER — Emergency Department (HOSPITAL_COMMUNITY)
Admission: EM | Admit: 2017-03-02 | Discharge: 2017-03-02 | Disposition: A | Discharge status: Home / Self Care | Payer: Non-veteran care | Attending: Emergency Medicine | Admitting: Emergency Medicine

## 2017-03-02 ENCOUNTER — Encounter (HOSPITAL_COMMUNITY): Payer: Self-pay | Admitting: Emergency Medicine

## 2017-03-02 DIAGNOSIS — R112 Nausea with vomiting, unspecified: Secondary | ICD-10-CM | POA: Insufficient documentation

## 2017-03-02 DIAGNOSIS — R197 Diarrhea, unspecified: Secondary | ICD-10-CM

## 2017-03-02 DIAGNOSIS — F17210 Nicotine dependence, cigarettes, uncomplicated: Secondary | ICD-10-CM | POA: Insufficient documentation

## 2017-03-02 DIAGNOSIS — Z79899 Other long term (current) drug therapy: Secondary | ICD-10-CM | POA: Insufficient documentation

## 2017-03-02 DIAGNOSIS — M791 Myalgia, unspecified site: Secondary | ICD-10-CM | POA: Diagnosis present

## 2017-03-02 DIAGNOSIS — I10 Essential (primary) hypertension: Secondary | ICD-10-CM | POA: Diagnosis not present

## 2017-03-02 LAB — CBC WITH DIFFERENTIAL/PLATELET
BASOS ABS: 0 10*3/uL (ref 0.0–0.1)
Basophils Relative: 0 %
EOS ABS: 0.4 10*3/uL (ref 0.0–0.7)
Eosinophils Relative: 6 %
HCT: 42.1 % (ref 39.0–52.0)
HEMOGLOBIN: 13.8 g/dL (ref 13.0–17.0)
LYMPHS ABS: 2.2 10*3/uL (ref 0.7–4.0)
Lymphocytes Relative: 32 %
MCH: 31.7 pg (ref 26.0–34.0)
MCHC: 32.8 g/dL (ref 30.0–36.0)
MCV: 96.6 fL (ref 78.0–100.0)
Monocytes Absolute: 0.5 10*3/uL (ref 0.1–1.0)
Monocytes Relative: 8 %
Neutro Abs: 3.6 10*3/uL (ref 1.7–7.7)
Neutrophils Relative %: 54 %
Platelets: 369 10*3/uL (ref 150–400)
RBC: 4.36 MIL/uL (ref 4.22–5.81)
RDW: 12.8 % (ref 11.5–15.5)
WBC: 6.7 10*3/uL (ref 4.0–10.5)

## 2017-03-02 LAB — COMPREHENSIVE METABOLIC PANEL
ALBUMIN: 3.9 g/dL (ref 3.5–5.0)
ALT: 22 U/L (ref 17–63)
AST: 19 U/L (ref 15–41)
Alkaline Phosphatase: 95 U/L (ref 38–126)
Anion gap: 6 (ref 5–15)
BUN: 10 mg/dL (ref 6–20)
CHLORIDE: 102 mmol/L (ref 101–111)
CO2: 32 mmol/L (ref 22–32)
CREATININE: 0.82 mg/dL (ref 0.61–1.24)
Calcium: 9.1 mg/dL (ref 8.9–10.3)
GFR calc Af Amer: >60 mL/min (ref >60)
GLUCOSE: 100 mg/dL — AB (ref 65–99)
POTASSIUM: 4.1 mmol/L (ref 3.5–5.1)
Sodium: 140 mmol/L (ref 135–145)
Total Bilirubin: 0.3 mg/dL (ref 0.3–1.2)
Total Protein: 7.3 g/dL (ref 6.5–8.1)

## 2017-03-02 LAB — LIPASE, BLOOD: LIPASE: 26 U/L (ref 11–51)

## 2017-03-02 MED ORDER — ONDANSETRON HCL 4 MG PO TABS: 4.0000 mg | ORAL_TABLET | Freq: Four times a day (QID) | ORAL | 0 refills | Status: DC

## 2017-03-02 MED ORDER — ONDANSETRON 8 MG PO TBDP
8.0000 mg | ORAL_TABLET | Freq: Once | ORAL | Status: AC
  Administered 2017-03-02: 8 mg via ORAL
  Filled 2017-03-02: qty 1

## 2017-03-02 NOTE — ED Triage Notes (Signed)
Pt reports 3-4 days of n/v/d with generalized body aches and chills.  Pt was at vending machine when called for triage.

## 2017-03-02 NOTE — Discharge Instructions (Signed)
Small, frequent amts of fluids today, bland diet as tolerated.  Follow-up with your doctor or return here for any worsening symptoms
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
## 2017-04-11 ENCOUNTER — Emergency Department (HOSPITAL_COMMUNITY): Payer: Non-veteran care

## 2017-04-11 ENCOUNTER — Emergency Department (HOSPITAL_COMMUNITY)
Admission: EM | Admit: 2017-04-11 | Discharge: 2017-04-11 | Disposition: A | Discharge status: Home / Self Care | Payer: Non-veteran care | Attending: Emergency Medicine | Admitting: Emergency Medicine

## 2017-04-11 ENCOUNTER — Encounter (HOSPITAL_COMMUNITY): Payer: Self-pay | Admitting: Emergency Medicine

## 2017-04-11 DIAGNOSIS — R200 Anesthesia of skin: Secondary | ICD-10-CM | POA: Insufficient documentation

## 2017-04-11 DIAGNOSIS — F17210 Nicotine dependence, cigarettes, uncomplicated: Secondary | ICD-10-CM | POA: Insufficient documentation

## 2017-04-11 DIAGNOSIS — R0789 Other chest pain: Secondary | ICD-10-CM | POA: Insufficient documentation

## 2017-04-11 DIAGNOSIS — I10 Essential (primary) hypertension: Secondary | ICD-10-CM | POA: Insufficient documentation

## 2017-04-11 DIAGNOSIS — R0600 Dyspnea, unspecified: Secondary | ICD-10-CM | POA: Insufficient documentation

## 2017-04-11 DIAGNOSIS — Z79899 Other long term (current) drug therapy: Secondary | ICD-10-CM | POA: Diagnosis not present

## 2017-04-11 DIAGNOSIS — M542 Cervicalgia: Secondary | ICD-10-CM | POA: Insufficient documentation

## 2017-04-11 DIAGNOSIS — R079 Chest pain, unspecified: Secondary | ICD-10-CM | POA: Diagnosis present

## 2017-04-11 HISTORY — DX: Post-traumatic stress disorder, unspecified: F43.10

## 2017-04-11 LAB — CBC
HEMATOCRIT: 40.7 % (ref 39.0–52.0)
HEMOGLOBIN: 13.3 g/dL (ref 13.0–17.0)
MCH: 31 pg (ref 26.0–34.0)
MCHC: 32.7 g/dL (ref 30.0–36.0)
MCV: 94.9 fL (ref 78.0–100.0)
Platelets: 446 10*3/uL — ABNORMAL HIGH (ref 150–400)
RBC: 4.29 MIL/uL (ref 4.22–5.81)
RDW: 13 % (ref 11.5–15.5)
WBC: 8.2 10*3/uL (ref 4.0–10.5)

## 2017-04-11 LAB — BASIC METABOLIC PANEL
ANION GAP: 9 (ref 5–15)
BUN: 9 mg/dL (ref 6–20)
CO2: 28 mmol/L (ref 22–32)
Calcium: 9.5 mg/dL (ref 8.9–10.3)
Chloride: 106 mmol/L (ref 101–111)
Creatinine, Ser: 0.7 mg/dL (ref 0.61–1.24)
GLUCOSE: 87 mg/dL (ref 65–99)
POTASSIUM: 4.2 mmol/L (ref 3.5–5.1)
Sodium: 143 mmol/L (ref 135–145)

## 2017-04-11 LAB — TROPONIN I
Troponin I: <0.03 ng/mL (ref <0.03)
Troponin I: <0.03 ng/mL (ref <0.03)

## 2017-04-11 LAB — D-DIMER, QUANTITATIVE (NOT AT ARMC): D DIMER QUANT: 0.27 ug{FEU}/mL (ref 0.00–0.50)

## 2017-04-11 MED ORDER — SUCRALFATE 1 GM/10ML PO SUSP
1.0000 g | Freq: Three times a day (TID) | ORAL | Status: DC
  Administered 2017-04-11: 1 g via ORAL
  Filled 2017-04-11 (×2): qty 10

## 2017-04-11 MED ORDER — SODIUM CHLORIDE 0.9 % IV BOLUS (SEPSIS)
1000.0000 mL | Freq: Once | INTRAVENOUS | Status: AC
  Administered 2017-04-11: 1000 mL via INTRAVENOUS

## 2017-04-11 MED ORDER — MORPHINE SULFATE (PF) 4 MG/ML IV SOLN
4.0000 mg | Freq: Once | INTRAVENOUS | Status: AC
  Administered 2017-04-11: 4 mg via INTRAVENOUS
  Filled 2017-04-11: qty 1

## 2017-04-11 MED ORDER — PREDNISONE 20 MG PO TABS: 40.0000 mg | ORAL_TABLET | Freq: Every day | ORAL | 0 refills | Status: DC

## 2017-04-11 MED ORDER — LORAZEPAM 2 MG/ML IJ SOLN
1.0000 mg | Freq: Once | INTRAMUSCULAR | Status: AC
  Administered 2017-04-11: 1 mg via INTRAVENOUS
  Filled 2017-04-11: qty 1

## 2017-04-11 MED ORDER — OMEPRAZOLE 20 MG PO CPDR: 20.0000 mg | DELAYED_RELEASE_CAPSULE | Freq: Every day | ORAL | 0 refills | Status: DC

## 2017-04-11 MED ORDER — PANTOPRAZOLE SODIUM 40 MG PO TBEC
40.0000 mg | DELAYED_RELEASE_TABLET | Freq: Every day | ORAL | Status: DC
  Administered 2017-04-11 (×2): 40 mg via ORAL
  Filled 2017-04-11 (×2): qty 1

## 2017-04-11 NOTE — Discharge Instructions (Signed)
As discussed, in addition to following up with the [facility], it is important to investigate options for massage therapy to assist with your ongoing pain in your neck and trapezius.  Please take all medication as directed, and do not hesitate to return for concerning changes in your condition.
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
## 2017-04-11 NOTE — ED Triage Notes (Signed)
Pt reports he has been having chest pain with left arm and upper back for a week.

## 2017-04-11 NOTE — ED Notes (Signed)
Pt states he normally takes 3 Clonidine per day and today he has taken about 8.

## 2017-04-12 ENCOUNTER — Encounter (HOSPITAL_COMMUNITY): Payer: Self-pay | Admitting: Emergency Medicine

## 2017-04-12 ENCOUNTER — Other Ambulatory Visit: Payer: Self-pay

## 2017-04-12 ENCOUNTER — Emergency Department (HOSPITAL_COMMUNITY)
Admission: EM | Admit: 2017-04-12 | Discharge: 2017-04-12 | Disposition: A | Discharge status: Home / Self Care | Payer: Non-veteran care | Attending: Emergency Medicine | Admitting: Emergency Medicine

## 2017-04-12 DIAGNOSIS — S51812A Laceration without foreign body of left forearm, initial encounter: Secondary | ICD-10-CM | POA: Insufficient documentation

## 2017-04-12 DIAGNOSIS — Y9389 Activity, other specified: Secondary | ICD-10-CM | POA: Insufficient documentation

## 2017-04-12 DIAGNOSIS — W25XXXA Contact with sharp glass, initial encounter: Secondary | ICD-10-CM | POA: Diagnosis not present

## 2017-04-12 DIAGNOSIS — I10 Essential (primary) hypertension: Secondary | ICD-10-CM | POA: Insufficient documentation

## 2017-04-12 DIAGNOSIS — S59912A Unspecified injury of left forearm, initial encounter: Secondary | ICD-10-CM | POA: Diagnosis present

## 2017-04-12 DIAGNOSIS — Z79899 Other long term (current) drug therapy: Secondary | ICD-10-CM | POA: Diagnosis not present

## 2017-04-12 DIAGNOSIS — Y999 Unspecified external cause status: Secondary | ICD-10-CM | POA: Insufficient documentation

## 2017-04-12 DIAGNOSIS — F17210 Nicotine dependence, cigarettes, uncomplicated: Secondary | ICD-10-CM | POA: Insufficient documentation

## 2017-04-12 DIAGNOSIS — Y929 Unspecified place or not applicable: Secondary | ICD-10-CM | POA: Insufficient documentation

## 2017-04-12 MED ORDER — LIDOCAINE-EPINEPHRINE (PF) 2 %-1:200000 IJ SOLN
10.0000 mL | Freq: Once | INTRAMUSCULAR | Status: AC
  Administered 2017-04-12: 10 mL via INTRADERMAL
  Filled 2017-04-12: qty 20

## 2017-04-12 MED ORDER — LORAZEPAM 2 MG/ML IJ SOLN
1.0000 mg | Freq: Once | INTRAMUSCULAR | Status: AC
  Administered 2017-04-12: 1 mg via INTRAMUSCULAR
  Filled 2017-04-12: qty 1

## 2017-04-12 MED ORDER — LORAZEPAM 1 MG PO TABS: 1.0000 mg | ORAL_TABLET | Freq: Once | ORAL | Status: DC
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
## 2017-04-12 NOTE — ED Triage Notes (Signed)
Pt states he was trying to fix a window when the window shattered and "sliced my forearm open." Bleeding controlled at this time.

## 2017-04-12 NOTE — Discharge Instructions (Signed)
Keep the wound clean with mild soap and water and keep it bandaged.  You may apply neosporin to the wound once daily.  Elevate your arm when possible to reduce swelling.  Staples out in 8-10 days

## 2017-05-06 ENCOUNTER — Encounter (HOSPITAL_COMMUNITY): Payer: Self-pay | Admitting: Emergency Medicine

## 2017-05-06 ENCOUNTER — Other Ambulatory Visit: Payer: Self-pay

## 2017-05-06 ENCOUNTER — Emergency Department (HOSPITAL_COMMUNITY)
Admission: EM | Admit: 2017-05-06 | Discharge: 2017-05-07 | Disposition: A | Discharge status: Home / Self Care | Payer: Non-veteran care | Attending: Emergency Medicine | Admitting: Emergency Medicine

## 2017-05-06 DIAGNOSIS — F17210 Nicotine dependence, cigarettes, uncomplicated: Secondary | ICD-10-CM | POA: Insufficient documentation

## 2017-05-06 DIAGNOSIS — Z79899 Other long term (current) drug therapy: Secondary | ICD-10-CM | POA: Insufficient documentation

## 2017-05-06 DIAGNOSIS — F329 Major depressive disorder, single episode, unspecified: Secondary | ICD-10-CM | POA: Insufficient documentation

## 2017-05-06 DIAGNOSIS — I10 Essential (primary) hypertension: Secondary | ICD-10-CM | POA: Insufficient documentation

## 2017-05-06 DIAGNOSIS — R45851 Suicidal ideations: Secondary | ICD-10-CM | POA: Diagnosis not present

## 2017-05-06 DIAGNOSIS — Z046 Encounter for general psychiatric examination, requested by authority: Secondary | ICD-10-CM | POA: Diagnosis not present

## 2017-05-06 DIAGNOSIS — F419 Anxiety disorder, unspecified: Secondary | ICD-10-CM | POA: Insufficient documentation

## 2017-05-06 LAB — CBC WITH DIFFERENTIAL/PLATELET
Basophils Absolute: 0 10*3/uL (ref 0.0–0.1)
Basophils Relative: 0 %
EOS PCT: 0 %
Eosinophils Absolute: 0 10*3/uL (ref 0.0–0.7)
HCT: 39.5 % (ref 39.0–52.0)
Hemoglobin: 13.1 g/dL (ref 13.0–17.0)
LYMPHS ABS: 1.1 10*3/uL (ref 0.7–4.0)
Lymphocytes Relative: 8 %
MCH: 31.2 pg (ref 26.0–34.0)
MCHC: 33.2 g/dL (ref 30.0–36.0)
MCV: 94 fL (ref 78.0–100.0)
MONOS PCT: 9 %
Monocytes Absolute: 1.3 10*3/uL — ABNORMAL HIGH (ref 0.1–1.0)
Neutro Abs: 12 10*3/uL — ABNORMAL HIGH (ref 1.7–7.7)
Neutrophils Relative %: 83 %
PLATELETS: 335 10*3/uL (ref 150–400)
RBC: 4.2 MIL/uL — ABNORMAL LOW (ref 4.22–5.81)
RDW: 12.7 % (ref 11.5–15.5)
WBC: 14.4 10*3/uL — ABNORMAL HIGH (ref 4.0–10.5)

## 2017-05-06 LAB — COMPREHENSIVE METABOLIC PANEL
ALT: 23 U/L (ref 17–63)
ANION GAP: 12 (ref 5–15)
AST: 18 U/L (ref 15–41)
Albumin: 4.6 g/dL (ref 3.5–5.0)
Alkaline Phosphatase: 106 U/L (ref 38–126)
BUN: 16 mg/dL (ref 6–20)
CHLORIDE: 104 mmol/L (ref 101–111)
CO2: 24 mmol/L (ref 22–32)
Calcium: 9.4 mg/dL (ref 8.9–10.3)
Creatinine, Ser: 0.97 mg/dL (ref 0.61–1.24)
GFR calc non Af Amer: >60 mL/min (ref >60)
Glucose, Bld: 134 mg/dL — ABNORMAL HIGH (ref 65–99)
POTASSIUM: 3.3 mmol/L — AB (ref 3.5–5.1)
SODIUM: 140 mmol/L (ref 135–145)
Total Bilirubin: 0.8 mg/dL (ref 0.3–1.2)
Total Protein: 8.1 g/dL (ref 6.5–8.1)

## 2017-05-06 LAB — ETHANOL: Alcohol, Ethyl (B): <10 mg/dL (ref <10)

## 2017-05-06 MED ORDER — GABAPENTIN 300 MG PO CAPS
300.0000 mg | ORAL_CAPSULE | Freq: Four times a day (QID) | ORAL | Status: DC
  Administered 2017-05-06 – 2017-05-07 (×2): 300 mg via ORAL
  Filled 2017-05-06 (×2): qty 1

## 2017-05-06 MED ORDER — SERTRALINE HCL 50 MG PO TABS
25.0000 mg | ORAL_TABLET | Freq: Two times a day (BID) | ORAL | Status: DC
  Administered 2017-05-06: 25 mg via ORAL
  Filled 2017-05-06 (×2): qty 1

## 2017-05-06 MED ORDER — CLONIDINE HCL 0.2 MG PO TABS
0.2000 mg | ORAL_TABLET | Freq: Four times a day (QID) | ORAL | Status: DC
  Administered 2017-05-06: 0.2 mg via ORAL
  Filled 2017-05-06 (×2): qty 1

## 2017-05-06 MED ORDER — ONDANSETRON 4 MG PO TBDP
4.0000 mg | ORAL_TABLET | Freq: Once | ORAL | Status: AC
  Administered 2017-05-06: 4 mg via ORAL

## 2017-05-06 MED ORDER — ONDANSETRON 4 MG PO TBDP
ORAL_TABLET | ORAL | Status: AC
  Filled 2017-05-06: qty 1

## 2017-05-06 MED ORDER — GI COCKTAIL ~~LOC~~
30.0000 mL | Freq: Once | ORAL | Status: AC
  Administered 2017-05-06: 30 mL via ORAL
  Filled 2017-05-06: qty 30

## 2017-05-06 MED ORDER — PANTOPRAZOLE SODIUM 40 MG PO TBEC
40.0000 mg | DELAYED_RELEASE_TABLET | Freq: Every day | ORAL | Status: DC
  Administered 2017-05-06 – 2017-05-07 (×2): 40 mg via ORAL
  Filled 2017-05-06 (×2): qty 1

## 2017-05-06 MED ORDER — PREDNISONE 20 MG PO TABS
40.0000 mg | ORAL_TABLET | Freq: Every day | ORAL | Status: DC
  Administered 2017-05-07: 40 mg via ORAL
  Filled 2017-05-06: qty 2

## 2017-05-06 MED ORDER — AMPHETAMINE-DEXTROAMPHETAMINE 10 MG PO TABS
20.0000 mg | ORAL_TABLET | Freq: Two times a day (BID) | ORAL | Status: DC
  Administered 2017-05-06 – 2017-05-07 (×2): 20 mg via ORAL
  Filled 2017-05-06 (×2): qty 2

## 2017-05-06 MED ORDER — MELOXICAM 7.5 MG PO TABS
7.5000 mg | ORAL_TABLET | Freq: Every day | ORAL | Status: DC
  Filled 2017-05-06 (×2): qty 1

## 2017-05-06 MED ORDER — MIRTAZAPINE 15 MG PO TABS
15.0000 mg | ORAL_TABLET | Freq: Every day | ORAL | Status: DC
  Administered 2017-05-06: 15 mg via ORAL
  Filled 2017-05-06 (×3): qty 1

## 2017-05-06 NOTE — ED Notes (Signed)
Pt asking for something for indigestion. Advised EDP.

## 2017-05-06 NOTE — Progress Notes (Signed)
Pt will be held for observation overnight to ensure safety and security and [facility] the morning. If pt is unwilling to follow this plan, IVC may be necessary. This plan was shared with pt's nurse, Ron RN at [phone], and then shared with pt's EDP, Dr. Estell HarpinZammit.

## 2017-05-06 NOTE — ED Notes (Signed)
Pt changed into paper scrubs & provided a frozen meal.
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
## 2017-05-06 NOTE — ED Notes (Signed)
[facility] to [facility] overnight & going to try and get [facility]. [facility] to Dr Estell HarpinZammit so he could be given this information.
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
## 2017-05-07 LAB — RAPID URINE DRUG SCREEN, HOSP PERFORMED
AMPHETAMINES: NOT DETECTED
BARBITURATES: NOT DETECTED
BENZODIAZEPINES: POSITIVE — AB
COCAINE: NOT DETECTED
Opiates: NOT DETECTED
Tetrahydrocannabinol: NOT DETECTED

## 2017-05-07 MED ORDER — POTASSIUM CHLORIDE CRYS ER 20 MEQ PO TBCR
40.0000 meq | EXTENDED_RELEASE_TABLET | Freq: Once | ORAL | Status: AC
  Administered 2017-05-07: 40 meq via ORAL
  Filled 2017-05-07: qty 2

## 2017-05-07 NOTE — ED Notes (Signed)
Dr Denton Lanksteinl informed that patient continues to have a tachy pulse, dr Denton Lanksteinl stating pt is appropriate for discharge.

## 2017-05-07 NOTE — ED Notes (Signed)
[employer] [place] [employer] To pick [place].  They will send an Technical sales engineerofficer.  Nurse informed.

## 2017-05-07 NOTE — Progress Notes (Signed)
Pt seen and reassessed by TTS and Dr. Lucianne MussKumar.  Pt does not meet inpatient criteria and is recommended for discharge.  CSW notified Charge Nurse, Angelica ChessmanMandy, RN, and asked that IVC be rescinded.  TTS consult note in progress.  Timmothy EulerJean T. Kaylyn LimSutter, MSW, LCSWA Disposition Clinical Social Work [phone] (cell) [phone] (office)

## 2017-05-07 NOTE — ED Notes (Signed)
Pt given breakfast tray

## 2017-05-07 NOTE — ED Notes (Signed)
Rescinded IVC Papers faxed to Magistrate. 
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
## 2017-05-07 NOTE — ED Provider Notes (Signed)
Behavioral health team has re-assessed - they indicate pt cleared for discharged to home.  Patient is awake and alert, and has normal mood and affect.  No thoughts of harm to self or others. Pt does not appear delusional or acutely psychotic.   Close outpatient f/u recommended.  Resource guide provided.      Cathren LaineSteinl, Shiva Sahagian, MD 05/07/17 [phone]
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
## 2017-05-07 NOTE — ED Notes (Signed)
Patient has been discharged at this time. Patient has been given paperwork. Pt waiting for [place] [employer] at this time. Thayer OhmChris, rn, has assumed care of patient and aware of discharge.

## 2017-05-07 NOTE — Discharge Instructions (Addendum)
It was our pleasure to provide your ER care today - we hope that you feel better.  Follow up with primary care doctor in the coming week.  Also follow up with therapist/counselor in the coming week.  See resource guide provided for additional community resources.  From lab results, your potassium level is mildly low (3.3) - eat plenty of fruits and vegetables, and follow up with primary care doctor.  Return to ER if worse, new symptoms, fevers, trouble breathing, severe depression, thoughts of harm to self/others, other concern.
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
## 2017-07-16 ENCOUNTER — Emergency Department (HOSPITAL_COMMUNITY): Payer: Non-veteran care

## 2017-07-16 ENCOUNTER — Observation Stay (HOSPITAL_COMMUNITY)
Admission: EM | Admit: 2017-07-16 | Discharge: 2017-07-19 | Disposition: A | Discharge status: Home / Self Care | Payer: Non-veteran care | Attending: Internal Medicine | Admitting: Internal Medicine

## 2017-07-16 ENCOUNTER — Encounter (HOSPITAL_COMMUNITY): Payer: Self-pay | Admitting: Emergency Medicine

## 2017-07-16 ENCOUNTER — Other Ambulatory Visit: Payer: Self-pay

## 2017-07-16 DIAGNOSIS — K529 Noninfective gastroenteritis and colitis, unspecified: Secondary | ICD-10-CM | POA: Diagnosis present

## 2017-07-16 DIAGNOSIS — F419 Anxiety disorder, unspecified: Secondary | ICD-10-CM | POA: Diagnosis not present

## 2017-07-16 DIAGNOSIS — F909 Attention-deficit hyperactivity disorder, unspecified type: Secondary | ICD-10-CM | POA: Insufficient documentation

## 2017-07-16 DIAGNOSIS — R29898 Other symptoms and signs involving the musculoskeletal system: Secondary | ICD-10-CM | POA: Diagnosis not present

## 2017-07-16 DIAGNOSIS — R531 Weakness: Secondary | ICD-10-CM | POA: Diagnosis present

## 2017-07-16 DIAGNOSIS — Z79899 Other long term (current) drug therapy: Secondary | ICD-10-CM | POA: Diagnosis not present

## 2017-07-16 DIAGNOSIS — I10 Essential (primary) hypertension: Secondary | ICD-10-CM | POA: Diagnosis present

## 2017-07-16 DIAGNOSIS — F4310 Post-traumatic stress disorder, unspecified: Secondary | ICD-10-CM | POA: Diagnosis not present

## 2017-07-16 DIAGNOSIS — R079 Chest pain, unspecified: Secondary | ICD-10-CM | POA: Diagnosis present

## 2017-07-16 DIAGNOSIS — R9431 Abnormal electrocardiogram [ECG] [EKG]: Secondary | ICD-10-CM | POA: Diagnosis present

## 2017-07-16 DIAGNOSIS — F988 Other specified behavioral and emotional disorders with onset usually occurring in childhood and adolescence: Secondary | ICD-10-CM | POA: Diagnosis present

## 2017-07-16 HISTORY — DX: Urinary calculus, unspecified: N20.9

## 2017-07-16 HISTORY — DX: Other specified behavioral and emotional disorders with onset usually occurring in childhood and adolescence: F98.8

## 2017-07-16 LAB — I-STAT CHEM 8, ED
BUN: 10 mg/dL (ref 6–20)
CREATININE: 0.8 mg/dL (ref 0.61–1.24)
Calcium, Ion: 1.19 mmol/L (ref 1.15–1.40)
Chloride: 100 mmol/L — ABNORMAL LOW (ref 101–111)
Glucose, Bld: 58 mg/dL — ABNORMAL LOW (ref 65–99)
HEMATOCRIT: 46 % (ref 39.0–52.0)
HEMOGLOBIN: 15.6 g/dL (ref 13.0–17.0)
Potassium: 3.4 mmol/L — ABNORMAL LOW (ref 3.5–5.1)
SODIUM: 141 mmol/L (ref 135–145)
TCO2: 28 mmol/L (ref 22–32)

## 2017-07-16 LAB — COMPREHENSIVE METABOLIC PANEL
ALBUMIN: 4.4 g/dL (ref 3.5–5.0)
ALT: 14 U/L — ABNORMAL LOW (ref 17–63)
ANION GAP: 11 (ref 5–15)
AST: 15 U/L (ref 15–41)
Alkaline Phosphatase: 94 U/L (ref 38–126)
BUN: 10 mg/dL (ref 6–20)
CO2: 28 mmol/L (ref 22–32)
Calcium: 9.8 mg/dL (ref 8.9–10.3)
Chloride: 102 mmol/L (ref 101–111)
Creatinine, Ser: 0.87 mg/dL (ref 0.61–1.24)
GFR calc Af Amer: >60 mL/min (ref >60)
Glucose, Bld: 61 mg/dL — ABNORMAL LOW (ref 65–99)
POTASSIUM: 3 mmol/L — AB (ref 3.5–5.1)
Sodium: 141 mmol/L (ref 135–145)
Total Bilirubin: 0.3 mg/dL (ref 0.3–1.2)
Total Protein: 7.8 g/dL (ref 6.5–8.1)

## 2017-07-16 LAB — PHOSPHORUS: Phosphorus: 3.8 mg/dL (ref 2.5–4.6)

## 2017-07-16 LAB — URINALYSIS, ROUTINE W REFLEX MICROSCOPIC
Bilirubin Urine: NEGATIVE
GLUCOSE, UA: NEGATIVE mg/dL
Hgb urine dipstick: NEGATIVE
Ketones, ur: NEGATIVE mg/dL
LEUKOCYTES UA: NEGATIVE
Nitrite: NEGATIVE
PH: 7 (ref 5.0–8.0)
PROTEIN: NEGATIVE mg/dL

## 2017-07-16 LAB — DIFFERENTIAL
BASOS PCT: 0 %
Basophils Absolute: 0 10*3/uL (ref 0.0–0.1)
EOS ABS: 0.3 10*3/uL (ref 0.0–0.7)
EOS PCT: 2 %
LYMPHS ABS: 2.6 10*3/uL (ref 0.7–4.0)
Lymphocytes Relative: 21 %
MONOS PCT: 8 %
Monocytes Absolute: 1 10*3/uL (ref 0.1–1.0)
NEUTROS PCT: 69 %
Neutro Abs: 8.2 10*3/uL — ABNORMAL HIGH (ref 1.7–7.7)

## 2017-07-16 LAB — I-STAT TROPONIN, ED: TROPONIN I, POC: 0 ng/mL (ref 0.00–0.08)

## 2017-07-16 LAB — CBC
HCT: 45.2 % (ref 39.0–52.0)
HEMOGLOBIN: 15.2 g/dL (ref 13.0–17.0)
MCH: 31.8 pg (ref 26.0–34.0)
MCHC: 33.6 g/dL (ref 30.0–36.0)
MCV: 94.6 fL (ref 78.0–100.0)
Platelets: 408 10*3/uL — ABNORMAL HIGH (ref 150–400)
RBC: 4.78 MIL/uL (ref 4.22–5.81)
RDW: 13.4 % (ref 11.5–15.5)
WBC: 12 10*3/uL — ABNORMAL HIGH (ref 4.0–10.5)

## 2017-07-16 LAB — RAPID URINE DRUG SCREEN, HOSP PERFORMED
Amphetamines: POSITIVE — AB
Barbiturates: NOT DETECTED
Benzodiazepines: NOT DETECTED
COCAINE: NOT DETECTED
OPIATES: POSITIVE — AB
Tetrahydrocannabinol: NOT DETECTED

## 2017-07-16 LAB — CBG MONITORING, ED
Glucose-Capillary: 89 mg/dL (ref 65–99)
Glucose-Capillary: 112 mg/dL — ABNORMAL HIGH (ref 65–99)

## 2017-07-16 LAB — MAGNESIUM: MAGNESIUM: 1.7 mg/dL (ref 1.7–2.4)

## 2017-07-16 LAB — TROPONIN I: Troponin I: <0.03 ng/mL (ref <0.03)

## 2017-07-16 LAB — ETHANOL

## 2017-07-16 LAB — PROTIME-INR
INR: 1
Prothrombin Time: 13.1 seconds (ref 11.4–15.2)

## 2017-07-16 LAB — APTT: aPTT: 27 seconds (ref 24–36)

## 2017-07-16 MED ORDER — ACETAMINOPHEN 325 MG PO TABS
650.0000 mg | ORAL_TABLET | Freq: Four times a day (QID) | ORAL | Status: DC | PRN
  Administered 2017-07-18: 650 mg via ORAL
  Filled 2017-07-16: qty 2

## 2017-07-16 MED ORDER — IOPAMIDOL (ISOVUE-370) INJECTION 76%
100.0000 mL | Freq: Once | INTRAVENOUS | Status: AC | PRN
  Administered 2017-07-16: 100 mL via INTRAVENOUS

## 2017-07-16 MED ORDER — PANTOPRAZOLE SODIUM 40 MG PO TBEC
40.0000 mg | DELAYED_RELEASE_TABLET | Freq: Every day | ORAL | Status: DC
Start: 2017-07-16 — End: 2017-07-19
  Administered 2017-07-16 – 2017-07-19 (×4): 40 mg via ORAL
  Filled 2017-07-16 (×4): qty 1

## 2017-07-16 MED ORDER — ASPIRIN EC 81 MG PO TBEC
81.0000 mg | DELAYED_RELEASE_TABLET | Freq: Every day | ORAL | Status: DC
  Administered 2017-07-17 – 2017-07-19 (×3): 81 mg via ORAL
  Filled 2017-07-16 (×3): qty 1

## 2017-07-16 MED ORDER — SODIUM CHLORIDE 0.9 % IV BOLUS
500.0000 mL | Freq: Once | INTRAVENOUS | Status: AC
  Administered 2017-07-16 (×2): 500 mL via INTRAVENOUS

## 2017-07-16 MED ORDER — ACETAMINOPHEN 650 MG RE SUPP: 650.0000 mg | Freq: Four times a day (QID) | RECTAL | Status: DC | PRN

## 2017-07-16 MED ORDER — VENLAFAXINE HCL ER 37.5 MG PO CP24: 150.0000 mg | ORAL_CAPSULE | ORAL | Status: DC

## 2017-07-16 MED ORDER — STROKE: EARLY STAGES OF RECOVERY BOOK
Freq: Once | Status: AC
  Administered 2017-07-16: 23:00:00
  Filled 2017-07-16 (×2): qty 1

## 2017-07-16 MED ORDER — ACETAMINOPHEN 650 MG RE SUPP: 650.0000 mg | RECTAL | Status: DC | PRN

## 2017-07-16 MED ORDER — POTASSIUM CHLORIDE CRYS ER 20 MEQ PO TBCR
40.0000 meq | EXTENDED_RELEASE_TABLET | Freq: Once | ORAL | Status: AC
  Administered 2017-07-16: 40 meq via ORAL
  Filled 2017-07-16: qty 2

## 2017-07-16 MED ORDER — ONDANSETRON HCL 4 MG/2ML IJ SOLN: 4.0000 mg | Freq: Four times a day (QID) | INTRAMUSCULAR | Status: DC | PRN

## 2017-07-16 MED ORDER — VENLAFAXINE HCL ER 75 MG PO CP24
150.0000 mg | ORAL_CAPSULE | Freq: Every day | ORAL | Status: DC
  Administered 2017-07-16 – 2017-07-18 (×3): 150 mg via ORAL
  Filled 2017-07-16 (×4): qty 2

## 2017-07-16 MED ORDER — LORAZEPAM 2 MG/ML IJ SOLN: 1.0000 mg | Freq: Once | INTRAMUSCULAR | Status: DC | PRN

## 2017-07-16 MED ORDER — MAGNESIUM SULFATE 2 GM/50ML IV SOLN
2.0000 g | Freq: Once | INTRAVENOUS | Status: AC
  Administered 2017-07-16: 2 g via INTRAVENOUS
  Filled 2017-07-16: qty 50

## 2017-07-16 MED ORDER — LORAZEPAM 1 MG PO TABS
1.0000 mg | ORAL_TABLET | Freq: Every evening | ORAL | Status: AC | PRN
  Administered 2017-07-16: 1 mg via ORAL
  Filled 2017-07-16: qty 1

## 2017-07-16 MED ORDER — ENOXAPARIN SODIUM 40 MG/0.4ML ~~LOC~~ SOLN
40.0000 mg | SUBCUTANEOUS | Status: DC
  Administered 2017-07-16 – 2017-07-18 (×3): 40 mg via SUBCUTANEOUS
  Filled 2017-07-16 (×3): qty 0.4

## 2017-07-16 MED ORDER — GABAPENTIN 400 MG PO CAPS
400.0000 mg | ORAL_CAPSULE | Freq: Four times a day (QID) | ORAL | Status: DC
  Administered 2017-07-16 – 2017-07-19 (×10): 400 mg via ORAL
  Filled 2017-07-16 (×10): qty 1

## 2017-07-16 MED ORDER — ACETAMINOPHEN 325 MG PO TABS: 650.0000 mg | ORAL_TABLET | ORAL | Status: DC | PRN

## 2017-07-16 MED ORDER — CLONIDINE HCL 0.2 MG PO TABS
0.2000 mg | ORAL_TABLET | Freq: Four times a day (QID) | ORAL | Status: DC
  Administered 2017-07-16 – 2017-07-19 (×9): 0.2 mg via ORAL
  Filled 2017-07-16 (×10): qty 1

## 2017-07-16 MED ORDER — POTASSIUM CHLORIDE IN NACL 40-0.9 MEQ/L-% IV SOLN
INTRAVENOUS | Status: DC
  Administered 2017-07-16 – 2017-07-18 (×5): 125 mL/h via INTRAVENOUS
  Filled 2017-07-16 (×4): qty 1000

## 2017-07-16 MED ORDER — POTASSIUM CHLORIDE IN NACL 20-0.9 MEQ/L-% IV SOLN
INTRAVENOUS | Status: AC
  Administered 2017-07-16: 23:00:00 via INTRAVENOUS

## 2017-07-16 MED ORDER — SODIUM CHLORIDE 0.9 % IV SOLN
100.0000 mL/h | INTRAVENOUS | Status: DC
  Administered 2017-07-16: 100 mL/h via INTRAVENOUS

## 2017-07-16 MED ORDER — MORPHINE SULFATE (PF) 4 MG/ML IV SOLN
4.0000 mg | Freq: Once | INTRAVENOUS | Status: AC
  Administered 2017-07-16: 4 mg via INTRAVENOUS
  Filled 2017-07-16: qty 1

## 2017-07-16 MED ORDER — ENOXAPARIN SODIUM 40 MG/0.4ML ~~LOC~~ SOLN
40.0000 mg | SUBCUTANEOUS | Status: DC
Start: 2017-07-16 — End: 2017-07-16

## 2017-07-16 MED ORDER — LORAZEPAM 2 MG/ML IJ SOLN
1.0000 mg | Freq: Once | INTRAMUSCULAR | Status: AC
  Administered 2017-07-16: 1 mg via INTRAVENOUS
  Filled 2017-07-16: qty 1

## 2017-07-16 MED ORDER — ONDANSETRON HCL 4 MG/2ML IJ SOLN: 4.0000 mg | Freq: Once | INTRAMUSCULAR | Status: DC

## 2017-07-16 MED ORDER — VENLAFAXINE HCL ER 75 MG PO CP24
450.0000 mg | ORAL_CAPSULE | Freq: Every day | ORAL | Status: DC
  Administered 2017-07-17 – 2017-07-19 (×3): 450 mg via ORAL
  Filled 2017-07-16 (×2): qty 6

## 2017-07-16 MED ORDER — CLONIDINE HCL 0.2 MG PO TABS
0.2000 mg | ORAL_TABLET | Freq: Once | ORAL | Status: DC
  Filled 2017-07-16: qty 1

## 2017-07-16 MED ORDER — MIRTAZAPINE 15 MG PO TABS
15.0000 mg | ORAL_TABLET | Freq: Every day | ORAL | Status: DC
  Administered 2017-07-16 – 2017-07-18 (×3): 15 mg via ORAL
  Filled 2017-07-16 (×3): qty 1

## 2017-07-16 MED ORDER — ACETAMINOPHEN 160 MG/5ML PO SOLN: 650.0000 mg | ORAL | Status: DC | PRN

## 2017-07-16 MED ORDER — ONDANSETRON HCL 4 MG PO TABS: 4.0000 mg | ORAL_TABLET | Freq: Four times a day (QID) | ORAL | Status: DC | PRN

## 2017-07-16 NOTE — ED Notes (Signed)
Pt given food tray.

## 2017-07-16 NOTE — Progress Notes (Signed)
Code stroke 1716 call time 1720 beeper 1726 exam started 1728 exam ended, [facility]  1730 exam completed in Epic, called [place] [facility], spoke with Bjorn Loserhonda.
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
## 2017-07-16 NOTE — ED Triage Notes (Signed)
Patient complains of chest pain left sided weakness that started 1445. Patient is able to move all extremities in triage. He states drooling. Patient is tearful in triage.
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
## 2017-07-17 ENCOUNTER — Observation Stay (HOSPITAL_BASED_OUTPATIENT_CLINIC_OR_DEPARTMENT_OTHER): Payer: Non-veteran care

## 2017-07-17 ENCOUNTER — Observation Stay (HOSPITAL_COMMUNITY): Payer: Non-veteran care

## 2017-07-17 DIAGNOSIS — F419 Anxiety disorder, unspecified: Secondary | ICD-10-CM | POA: Diagnosis not present

## 2017-07-17 DIAGNOSIS — R079 Chest pain, unspecified: Secondary | ICD-10-CM

## 2017-07-17 DIAGNOSIS — F988 Other specified behavioral and emotional disorders with onset usually occurring in childhood and adolescence: Secondary | ICD-10-CM

## 2017-07-17 DIAGNOSIS — I10 Essential (primary) hypertension: Secondary | ICD-10-CM | POA: Diagnosis not present

## 2017-07-17 DIAGNOSIS — F4310 Post-traumatic stress disorder, unspecified: Secondary | ICD-10-CM | POA: Diagnosis not present

## 2017-07-17 DIAGNOSIS — R29898 Other symptoms and signs involving the musculoskeletal system: Secondary | ICD-10-CM

## 2017-07-17 LAB — CBC WITH DIFFERENTIAL/PLATELET
BASOS ABS: 0 10*3/uL (ref 0.0–0.1)
BASOS PCT: 0 %
EOS ABS: 0.3 10*3/uL (ref 0.0–0.7)
EOS PCT: 3 %
HCT: 39.4 % (ref 39.0–52.0)
Hemoglobin: 13.1 g/dL (ref 13.0–17.0)
Lymphocytes Relative: 29 %
Lymphs Abs: 3.1 10*3/uL (ref 0.7–4.0)
MCH: 31.6 pg (ref 26.0–34.0)
MCHC: 33.2 g/dL (ref 30.0–36.0)
MCV: 95.2 fL (ref 78.0–100.0)
Monocytes Absolute: 1.1 10*3/uL — ABNORMAL HIGH (ref 0.1–1.0)
Monocytes Relative: 10 %
Neutro Abs: 6 10*3/uL (ref 1.7–7.7)
Neutrophils Relative %: 58 %
PLATELETS: 329 10*3/uL (ref 150–400)
RBC: 4.14 MIL/uL — AB (ref 4.22–5.81)
RDW: 13.6 % (ref 11.5–15.5)
WBC: 10.5 10*3/uL (ref 4.0–10.5)

## 2017-07-17 LAB — BASIC METABOLIC PANEL
ANION GAP: 4 — AB (ref 5–15)
BUN: 10 mg/dL (ref 6–20)
CALCIUM: 8.6 mg/dL — AB (ref 8.9–10.3)
CO2: 25 mmol/L (ref 22–32)
Chloride: 110 mmol/L (ref 101–111)
Creatinine, Ser: 0.77 mg/dL (ref 0.61–1.24)
GFR calc Af Amer: >60 mL/min (ref >60)
Glucose, Bld: 92 mg/dL (ref 65–99)
POTASSIUM: 4.4 mmol/L (ref 3.5–5.1)
SODIUM: 139 mmol/L (ref 135–145)

## 2017-07-17 LAB — TROPONIN I
Troponin I: <0.03 ng/mL (ref <0.03)
Troponin I: <0.03 ng/mL (ref <0.03)

## 2017-07-17 LAB — LIPID PANEL
CHOL/HDL RATIO: 3.5 ratio
Cholesterol: 109 mg/dL (ref 0–200)
HDL: 31 mg/dL — AB (ref >40)
LDL Cholesterol: 58 mg/dL (ref 0–99)
Triglycerides: 102 mg/dL (ref <150)
VLDL: 20 mg/dL (ref 0–40)

## 2017-07-17 LAB — HEMOGLOBIN A1C
Hgb A1c MFr Bld: 5.1 % (ref 4.8–5.6)
Mean Plasma Glucose: 99.67 mg/dL

## 2017-07-17 LAB — ECHOCARDIOGRAM COMPLETE
HEIGHTINCHES: 69 in
WEIGHTICAEL: 3155.2 [oz_av]

## 2017-07-17 MED ORDER — ALPRAZOLAM 0.5 MG PO TABS
0.5000 mg | ORAL_TABLET | Freq: Two times a day (BID) | ORAL | Status: DC | PRN
  Administered 2017-07-17 – 2017-07-19 (×5): 0.5 mg via ORAL
  Filled 2017-07-17 (×5): qty 1

## 2017-07-17 NOTE — Evaluation (Addendum)
Physical Therapy Evaluation Patient Details Name: Andrew Donaldson MRN: [id number] DOB: 1980-06-01 Today's Date: 07/17/2017   History of Present Illness  Andrew Donaldson is a 37 y.o. male with medical history significant of ADD, anxiety, PTSD, hypertension, or lithiasis, history of 2 ureteral stent placement who is coming to the emergency department due to chest pain, left upper extremity numbness and weakness since earlier today.  He denies blurred vision, gait impairment, slurred speech or language comprehension and ability.  No fever, chills, sore throat, wheezing, hemoptysis, dyspnea, chest pain, palpitations, PND, orthopnea or pitting edema lower extremities.  No nausea, emesis, diarrhea constipation.  Denies dysuria, frequency or hematuria.  Denies skin rashes.  No polyuria, polydipsia or polyphagia.    Clinical Impression  Patient functioning near baseline for functional mobility and gait, other than tendency to lean on wall, nearby objects when attempting to ambulate out side of room.  Patient declined to attempt extensive gait training due to c/o fatigue after going to a procedure.  Plan:  Patient will benefit from continued physical therapy in hospital and recommended venue below to increase strength, balance, endurance for safe ADLs and gait.     Follow Up Recommendations Outpatient PT(for gait training))    Equipment Recommendations  Cane    Recommendations for Other Services       Precautions / Restrictions Precautions Precautions: Fall Precaution Comments: patient states he fell at home Restrictions Weight Bearing Restrictions: No      Mobility  Bed Mobility Overal bed mobility: Independent                Transfers Overall transfer level: Modified independent                  Ambulation/Gait Ambulation/Gait assistance: Min guard Ambulation Distance (Feet): 25 Feet Assistive device: None Gait Pattern/deviations: Decreased step length -  left;Decreased stance time - left;Decreased stride length;Drifts right/left Gait velocity: decreased   General Gait Details: demonstrates slow unsteady cadence with occasional drifting and tendency to lean on nearby objects for support, limited for walking due to c/o fatigue  Stairs            Wheelchair Mobility    Modified Rankin (Stroke Patients Only)       Balance Overall balance assessment: Needs assistance Sitting-balance support: Feet unsupported;No upper extremity supported Sitting balance-Leahy Scale: Good     Standing balance support: No upper extremity supported;During functional activity Standing balance-Leahy Scale: Fair Standing balance comment: good static standing balance, tends to lean on objects when taking steps                             Pertinent Vitals/Pain Pain Assessment: 0-10 Pain Score: 7  Pain Location: left side of face Pain Descriptors / Indicators: Numbness;Discomfort Pain Intervention(s): Limited activity within patient's tolerance;Monitored during session    Home Living Family/patient expects to be discharged to:: Private residence Living Arrangements: Spouse/significant other Available Help at Discharge: Other (Comment)(patient states he has no help) Type of Home: House Home Access: Stairs to enter Entrance Stairs-Rails: None Entrance Stairs-Number of Steps: 8 Home Layout: Multi-level;Bed/bath upstairs Home Equipment: None      Prior Function Level of Independence: Independent               Hand Dominance        Extremity/Trunk Assessment   Upper Extremity Assessment Upper Extremity Assessment: Defer to OT evaluation    Lower Extremity Assessment Lower  Extremity Assessment: Overall WFL for tasks assessed;RLE deficits/detail;LLE deficits/detail RLE Deficits / Details: grossly 5/5 LLE Deficits / Details: grossly 4/5     Cervical / Trunk Assessment Cervical / Trunk Assessment: Normal  Communication    Communication: No difficulties  Cognition Arousal/Alertness: Awake/alert Behavior During Therapy: WFL for tasks assessed/performed Overall Cognitive Status: Within Functional Limits for tasks assessed                                        General Comments      Exercises     Assessment/Plan    PT Assessment Patient needs continued PT services  PT Problem List Decreased activity tolerance;Decreased strength;Decreased balance;Decreased mobility       PT Treatment Interventions Gait training;Stair training;Functional mobility training;Therapeutic activities;Patient/family education;Therapeutic exercise    PT Goals (Current goals can be found in the Care Plan section)  Acute Rehab PT Goals Patient Stated Goal: return home PT Goal Formulation: With patient Time For Goal Achievement: 07/21/17 Potential to Achieve Goals: Good    Frequency 7X/week   Barriers to discharge        Co-evaluation               AM-PAC PT "6 Clicks" Daily Activity  Outcome Measure Difficulty turning over in bed (including adjusting bedclothes, sheets and blankets)?: None Difficulty moving from lying on back to sitting on the side of the bed? : None Difficulty sitting down on and standing up from a chair with arms (e.g., wheelchair, bedside commode, etc,.)?: None Help needed moving to and from a bed to chair (including a wheelchair)?: A Little Help needed walking in hospital room?: A Little Help needed climbing 3-5 steps with a railing? : A Little 6 Click Score: 21    End of Session Equipment Utilized During Treatment: Gait belt Activity Tolerance: Patient limited by fatigue Patient left: in bed;with call bell/phone within reach Nurse Communication: Mobility status PT Visit Diagnosis: Unsteadiness on feet (R26.81);Other abnormalities of gait and mobility (R26.89);Muscle weakness (generalized) (M62.81)    Time: [id number] PT Time Calculation (min) (ACUTE ONLY): 19  min   Charges:   PT Evaluation $PT Eval Moderate Complexity: 1 Mod PT Treatments $Therapeutic Activity: 8-22 mins   PT G Codes:        10:39 AM, 07/17/17 Ocie BobJames Valdis Bevill, MPT Physical Therapist [facility] [phone] office [phone] mobile phone

## 2017-07-17 NOTE — Progress Notes (Signed)
*  PRELIMINARY RESULTS* Echocardiogram 2D Echocardiogram has been performed.  Stacey DrainWhite, Parker Wherley J 07/17/2017, 11:10 AM
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
## 2017-07-17 NOTE — Care Management (Signed)
[facility].   Pt's PCP is Dr. Lawernce Pittsharles Beers. His SW [facility] [phone]; pager - [phone].

## 2017-07-17 NOTE — Plan of Care (Signed)
  Problem: Acute Rehab PT Goals(only PT should resolve) Goal: Patient Will Transfer Sit To/From Stand Outcome: Progressing Flowsheets (Taken 07/17/2017 1034) Patient will transfer sit to/from stand: Independently Goal: Pt Will Transfer Bed To Chair/Chair To Bed Outcome: Progressing Flowsheets (Taken 07/17/2017 1034) Pt will Transfer Bed to Chair/Chair to Bed: Independently Goal: Pt Will Ambulate Outcome: Progressing Flowsheets (Taken 07/17/2017 1034) Pt will Ambulate: with modified independence;> 125 feet;with cane Goal: Pt Will Go Up/Down Stairs Outcome: Progressing Flowsheets (Taken 07/17/2017 1034) Pt will Go Up / Down Stairs: 6-9 stairs;with modified independence;with cane   10:36 AM, 07/17/17 Ocie BobJames Turner Kunzman, MPT Physical Therapist with [facility] 336 [phone] office [phone] mobile phone

## 2017-07-17 NOTE — Progress Notes (Signed)
OT Cancellation Note  Patient Details Name: Andrew Donaldson MRN: [id number] DOB: 09-28-1980   Cancelled Treatment:    Reason Eval/Treat Not Completed: OT screened, no needs identified, will sign off. Pt screened for OT needs, pt reporting LUE numbness, sensation is intact with formal testing. LUE strength is 5/5, grip strengths are equal, coordination is intact. Pt using phone with both hands while OT in room, no difficulty sitting up at EOB. Pt is functioning at baseline with ADLs and LUE functional use. No further OT services required at this time.    Ezra SitesLeslie Maxx Pham, OTR/L  [phone] 07/17/2017, 11:40 AM

## 2017-07-17 NOTE — Progress Notes (Signed)
PROGRESS NOTE    JYLAN LOEZA  ZOX:[id number] DOB: 1980/07/09 DOA: 07/16/2017 PCP: [facility]    Brief Narrative:  37 year old male admitted to the hospital with chest pain and left sided weakness.  Reports weakness in his left arm and lower extremity.  He ruled out for ACS with negative cardiac markers.  MRI of the brain did not show any acute infarct, but did comment on microhemorrhages.  Neurology consulted.   Assessment & Plan:   Principal Problem:   Left arm weakness Active Problems:   Gastroenteritis   Hypertension   Anxiety   PTSD (post-traumatic stress disorder)   ADD (attention deficit disorder)   Chest pain   Abnormal EKG   1. Left arm weakness.  Work-up for CVA has been unrevealing.  No acute infarct noted on MRI.  He was noted to have evidence of microhemorrhages.  Neurology has been consulted.  He continues to have some symptoms.  Unclear if these are related to true neurologic condition versus stress related 2. Hypertension.  Patient is currently on clonidine.  Blood pressures currently stable.  On conversation with the patient, he reports being prescribed clonidine for PTSD. 3. PTSD.  He is also on Effexor and Remeron. 4. ADD.  Patient is prescribed Adderall.  Is currently on hold. 5. Chest pain.  Atypical.  Negative cardiac markers and echocardiogram.   DVT prophylaxis: Lovenox Code Status: Full code Family Communication: No family present Disposition Plan: Discharge home after neurology evaluation   Consultants:     Procedures:     Antimicrobials:       Subjective: Reports continued weakness in her left upper and lower extremity.  Also has numbness in his left hand.  Slurred speech has resolved.  Felt that his symptoms began improving when he woke up from sleep this morning.  Objective: Vitals:   07/17/17 0923 07/17/17 1330 07/17/17 1435 07/17/17 1723  BP: 119/77 91/60 112/69 120/80  Pulse: 92 85 87 88  Resp: 18 18 17 18     Temp: 97.8 F (36.6 C) 98.2 F (36.8 C) 98.2 F (36.8 C) 99 F (37.2 C)  TempSrc: Oral Oral Oral Oral  SpO2: 97% 97% 97% 100%  Weight:      Height:        Intake/Output Summary (Last 24 hours) at 07/17/2017 1854 Last data filed at 07/17/2017 1800 Gross per 24 hour  Intake 3703.75 ml  Output 1 ml  Net 3702.75 ml   Filed Weights   07/16/17 1651 07/16/17 2123  Weight: 95.3 kg (210 lb) 89.4 kg (197 lb 3.2 oz)    Examination:  General exam: Appears calm and comfortable  Respiratory system: Clear to auscultation. Respiratory effort normal. Cardiovascular system: S1 & S2 heard, RRR. No JVD, murmurs, rubs, gallops or clicks. No pedal edema. Gastrointestinal system: Abdomen is nondistended, soft and nontender. No organomegaly or masses felt. Normal bowel sounds heard. Central nervous system: Alert and oriented. No focal neurological deficits. Extremities: Symmetric 5 x 5 power. Skin: No rashes, lesions or ulcers Psychiatry: Judgement and insight appear normal. Mood & affect appropriate.     Data Reviewed: I have personally reviewed following labs and imaging studies  CBC: Recent Labs  Lab 07/16/17 1704 07/16/17 1723 07/17/17 0252  WBC 12.0*  --  10.5  NEUTROABS 8.2*  --  6.0  HGB 15.2 15.6 13.1  HCT 45.2 46.0 39.4  MCV 94.6  --  95.2  PLT 408*  --  329   Basic Metabolic Panel: Recent  Labs  Lab 07/16/17 1704 07/16/17 1723 07/16/17 2023 07/17/17 0252  NA 141 141  --  139  K 3.0* 3.4*  --  4.4  CL 102 100*  --  110  CO2 28  --   --  25  GLUCOSE 61* 58*  --  92  BUN 10 10  --  10  CREATININE 0.87 0.80  --  0.77  CALCIUM 9.8  --   --  8.6*  MG  --   --  1.7  --   PHOS  --   --  3.8  --    GFR: Estimated Creatinine Clearance: 139.8 mL/min (by C-G formula based on SCr of 0.77 mg/dL). Liver Function Tests: Recent Labs  Lab 07/16/17 1704  AST 15  ALT 14*  ALKPHOS 94  BILITOT 0.3  PROT 7.8  ALBUMIN 4.4   No results for input(s): LIPASE, AMYLASE in the  last 168 hours. No results for input(s): AMMONIA in the last 168 hours. Coagulation Profile: Recent Labs  Lab 07/16/17 1704  INR 1.00   Cardiac Enzymes: Recent Labs  Lab 07/16/17 2023 07/17/17 0252 07/17/17 0925  TROPONINI <0.03 <0.03 <0.03   BNP (last 3 results) No results for input(s): PROBNP in the last 8760 hours. HbA1C: Recent Labs    07/16/17 1704  HGBA1C 5.1   CBG: Recent Labs  Lab 07/16/17 1656 07/16/17 2030  GLUCAP 89 112*   Lipid Profile: Recent Labs    07/17/17 0252  CHOL 109  HDL 31*  LDLCALC 58  TRIG 161  CHOLHDL 3.5   Thyroid Function Tests: No results for input(s): TSH, T4TOTAL, FREET4, T3FREE, THYROIDAB in the last 72 hours. Anemia Panel: No results for input(s): VITAMINB12, FOLATE, FERRITIN, TIBC, IRON, RETICCTPCT in the last 72 hours. Sepsis Labs: No results for input(s): PROCALCITON, LATICACIDVEN in the last 168 hours.  No results found for this or any previous visit (from the past 240 hour(s)).       Radiology Studies: Ct Angio Head W Or Wo Contrast  Result Date: 07/16/2017 CLINICAL DATA:  Follow up code stroke.  LEFT arm numbness. EXAM: CT ANGIOGRAPHY HEAD AND NECK TECHNIQUE: Multidetector CT imaging of the head and neck was performed using the standard protocol during bolus administration of intravenous contrast. Multiplanar CT image reconstructions and MIPs were obtained to evaluate the vascular anatomy. Carotid stenosis measurements (when applicable) are obtained utilizing NASCET criteria, using the distal internal carotid diameter as the denominator. CONTRAST:  ISOVUE-370 IOPAMIDOL (ISOVUE-370) INJECTION 76% COMPARISON:  CT HEAD July 16, 2017 FINDINGS: CTA NECK FINDINGS: AORTIC ARCH: Normal appearance of the thoracic arch, normal branch pattern. The origins of the innominate, left Common carotid artery and subclavian artery are widely patent. RIGHT CAROTID SYSTEM: Common carotid artery is patent. Normal appearance of the carotid  bifurcation without hemodynamically significant stenosis by NASCET criteria. Normal appearance of the internal carotid artery. LEFT CAROTID SYSTEM: Common carotid artery is patent. Normal appearance of the carotid bifurcation without hemodynamically significant stenosis by NASCET criteria. Normal appearance of the internal carotid artery. VERTEBRAL ARTERIES:Codominant vertebral arteries. Normal appearance of the vertebral arteries, widely patent. SKELETON: No acute osseous process though bone windows have not been submitted. Mild spondylosis facet arthropathy. Mild RIGHT C3-4 through C5-6 neural foraminal narrowing. OTHER NECK: Soft tissues of the neck are nonacute though, not tailored for evaluation. UPPER CHEST: Included lung apices are clear. No superior mediastinal lymphadenopathy. CTA HEAD FINDINGS: ANTERIOR CIRCULATION: Patent cervical internal carotid arteries, petrous, cavernous and supra clinoid  internal carotid arteries. Patent anterior communicating artery. Patent anterior and middle cerebral arteries. No large vessel occlusion, significant stenosis, contrast extravasation or aneurysm. POSTERIOR CIRCULATION: Patent vertebral arteries, vertebrobasilar junction and basilar artery, as well as main branch vessels. Patent posterior cerebral arteries. No large vessel occlusion, significant stenosis, contrast extravasation or aneurysm. VENOUS SINUSES: Major dural venous sinuses are patent though not tailored for evaluation on this angiographic examination. ANATOMIC VARIANTS: None. DELAYED PHASE: No abnormal intracranial enhancement. MIP images reviewed. IMPRESSION: 1. Normal CTA NECK. 2. Normal CTA HEAD. Electronically Signed   By: Awilda Metro M.D.   On: 07/16/2017 18:37   Ct Angio Neck W And/or Wo Contrast  Result Date: 07/16/2017 CLINICAL DATA:  Follow up code stroke.  LEFT arm numbness. EXAM: CT ANGIOGRAPHY HEAD AND NECK TECHNIQUE: Multidetector CT imaging of the head and neck was performed using  the standard protocol during bolus administration of intravenous contrast. Multiplanar CT image reconstructions and MIPs were obtained to evaluate the vascular anatomy. Carotid stenosis measurements (when applicable) are obtained utilizing NASCET criteria, using the distal internal carotid diameter as the denominator. CONTRAST:  ISOVUE-370 IOPAMIDOL (ISOVUE-370) INJECTION 76% COMPARISON:  CT HEAD July 16, 2017 FINDINGS: CTA NECK FINDINGS: AORTIC ARCH: Normal appearance of the thoracic arch, normal branch pattern. The origins of the innominate, left Common carotid artery and subclavian artery are widely patent. RIGHT CAROTID SYSTEM: Common carotid artery is patent. Normal appearance of the carotid bifurcation without hemodynamically significant stenosis by NASCET criteria. Normal appearance of the internal carotid artery. LEFT CAROTID SYSTEM: Common carotid artery is patent. Normal appearance of the carotid bifurcation without hemodynamically significant stenosis by NASCET criteria. Normal appearance of the internal carotid artery. VERTEBRAL ARTERIES:Codominant vertebral arteries. Normal appearance of the vertebral arteries, widely patent. SKELETON: No acute osseous process though bone windows have not been submitted. Mild spondylosis facet arthropathy. Mild RIGHT C3-4 through C5-6 neural foraminal narrowing. OTHER NECK: Soft tissues of the neck are nonacute though, not tailored for evaluation. UPPER CHEST: Included lung apices are clear. No superior mediastinal lymphadenopathy. CTA HEAD FINDINGS: ANTERIOR CIRCULATION: Patent cervical internal carotid arteries, petrous, cavernous and supra clinoid internal carotid arteries. Patent anterior communicating artery. Patent anterior and middle cerebral arteries. No large vessel occlusion, significant stenosis, contrast extravasation or aneurysm. POSTERIOR CIRCULATION: Patent vertebral arteries, vertebrobasilar junction and basilar artery, as well as main branch  vessels. Patent posterior cerebral arteries. No large vessel occlusion, significant stenosis, contrast extravasation or aneurysm. VENOUS SINUSES: Major dural venous sinuses are patent though not tailored for evaluation on this angiographic examination. ANATOMIC VARIANTS: None. DELAYED PHASE: No abnormal intracranial enhancement. MIP images reviewed. IMPRESSION: 1. Normal CTA NECK. 2. Normal CTA HEAD. Electronically Signed   By: Awilda Metro M.D.   On: 07/16/2017 18:37   Mr Brain Wo Contrast  Result Date: 07/17/2017 CLINICAL DATA:  LEFT-sided weakness for 2 days.  TIA, initial exam. EXAM: MRI HEAD WITHOUT CONTRAST MRA HEAD WITHOUT CONTRAST TECHNIQUE: Multiplanar, multiecho pulse sequences of the brain and surrounding structures were obtained without intravenous contrast. Angiographic images of the head were obtained using MRA technique without contrast. COMPARISON:  CT head without contrast, CTA head neck 07/16/2017 FINDINGS: MRI HEAD FINDINGS Brain: No evidence for acute infarction, hemorrhage, mass lesion, hydrocephalus, or extra-axial fluid. Normal for age cerebral volume. Mild subcortical and periventricular T2 and FLAIR hyperintensities, nonspecific, but abnormal for age, likely chronic microvascular ischemic change. Vascular: Flow voids are maintained throughout the carotid, basilar, and vertebral arteries. Tiny foci of susceptibility, RIGHT greater than  LEFT temporal/insular regions, see series 9, images 9-10, likely microbleeds related to chronic hypertension. Skull and upper cervical spine: Unremarkable visualized calvarium, skullbase, and cervical vertebrae. Pituitary, pineal, cerebellar tonsils unremarkable. No upper cervical cord lesions. Sinuses/Orbits: No orbital masses or proptosis. Globes appear symmetric. Sinuses appear well aerated, without evidence for air-fluid level. Other: None. MRA HEAD FINDINGS The internal carotid arteries are widely patent. The basilar artery is widely patent with  vertebrals codominant. No intracranial stenosis or aneurysm. IMPRESSION: Mild small vessel disease, with a few small scattered microbleeds, likely sequelae of hypertensive cerebrovascular disease. No acute intracranial findings. Negative MRA intracranial.  No stenosis or dissection. Electronically Signed   By: Elsie Stain M.D.   On: 07/17/2017 09:05   US Carotid Bilateral (at [facility] And [facility] Only)  Result Date: 07/17/2017 CLINICAL DATA:  37 year old male with left upper extremity weakness for 1 day EXAM: BILATERAL CAROTID DUPLEX ULTRASOUND TECHNIQUE: Wallace Cullens scale imaging, color Doppler and duplex ultrasound were performed of bilateral carotid and vertebral arteries in the neck. COMPARISON:  None. FINDINGS: Criteria: Quantification of carotid stenosis is based on velocity parameters that correlate the residual internal carotid diameter with NASCET-based stenosis levels, using the diameter of the distal internal carotid lumen as the denominator for stenosis measurement. The following velocity measurements were obtained: RIGHT ICA: 141/31 cm/sec CCA: 113/30 cm/sec SYSTOLIC ICA/CCA RATIO:  1.25 ECA:  187 cm/sec LEFT ICA: 81/25 cm/sec CCA: 114/31 cm/sec SYSTOLIC ICA/CCA RATIO:  1.0 ECA:  115 cm/sec RIGHT CAROTID ARTERY: While there is elevation of the peak systolic velocity in the proximal internal carotid artery, there is no evidence of underlying atherosclerotic plaque. Color flow fills the vessel lumen from wall to wall. The elevated peak systolic velocity is almost certainly spurious. RIGHT VERTEBRAL ARTERY:  Patent with normal antegrade flow. LEFT CAROTID ARTERY: No evidence of atherosclerotic plaque or stenosis. LEFT VERTEBRAL ARTERY:  Patent with normal antegrade flow. IMPRESSION: No evidence of internal carotid artery atherosclerotic plaque or significant stenosis. Both vertebral arteries are patent with normal antegrade flow. Signed, Sterling Big, MD [facility]  [facility] Radiology Electronically Signed   By: Malachy Moan M.D.   On: 07/17/2017 09:15   Mr Maxine Glenn Head/brain YN Cm  Result Date: 07/17/2017 CLINICAL DATA:  LEFT-sided weakness for 2 days.  TIA, initial exam. EXAM: MRI HEAD WITHOUT CONTRAST MRA HEAD WITHOUT CONTRAST TECHNIQUE: Multiplanar, multiecho pulse sequences of the brain and surrounding structures were obtained without intravenous contrast. Angiographic images of the head were obtained using MRA technique without contrast. COMPARISON:  CT head without contrast, CTA head neck 07/16/2017 FINDINGS: MRI HEAD FINDINGS Brain: No evidence for acute infarction, hemorrhage, mass lesion, hydrocephalus, or extra-axial fluid. Normal for age cerebral volume. Mild subcortical and periventricular T2 and FLAIR hyperintensities, nonspecific, but abnormal for age, likely chronic microvascular ischemic change. Vascular: Flow voids are maintained throughout the carotid, basilar, and vertebral arteries. Tiny foci of susceptibility, RIGHT greater than LEFT temporal/insular regions, see series 9, images 9-10, likely microbleeds related to chronic hypertension. Skull and upper cervical spine: Unremarkable visualized calvarium, skullbase, and cervical vertebrae. Pituitary, pineal, cerebellar tonsils unremarkable. No upper cervical cord lesions. Sinuses/Orbits: No orbital masses or proptosis. Globes appear symmetric. Sinuses appear well aerated, without evidence for air-fluid level. Other: None. MRA HEAD FINDINGS The internal carotid arteries are widely patent. The basilar artery is widely patent with vertebrals codominant. No intracranial stenosis or aneurysm. IMPRESSION: Mild small vessel disease, with a few small scattered microbleeds, likely sequelae of hypertensive cerebrovascular  disease. No acute intracranial findings. Negative MRA intracranial.  No stenosis or dissection. Electronically Signed   By: Elsie StainJohn T Curnes M.D.   On: 07/17/2017 09:05   Ct Angio Chest Aorta W  And/or Wo Contrast  Result Date: 07/16/2017 CLINICAL DATA:  Complex chest pain. Assess for dissection or pulmonary embolism. History of hypertension, lithotripsy. EXAM: CT ANGIOGRAPHY CHEST WITH CONTRAST TECHNIQUE: Multidetector CT imaging of the chest was performed using the standard protocol during bolus administration of intravenous contrast. Multiplanar CT image reconstructions and MIPs were obtained to evaluate the vascular anatomy. CONTRAST:  100mL ISOVUE-370 IOPAMIDOL (ISOVUE-370) INJECTION 76% COMPARISON:  Chest radiograph April 11, 2017 FINDINGS: CARDIOVASCULAR: Thoracic aorta is normal course and caliber. No intrinsic density on noncontrast CT. Respiratory motion through ascending aorta. Homogeneous contrast opacification of thoracic aorta without dissection, aneurysm, luminal irregularity, periaortic fluid collections, or contrast extravasation. Heart size is normal. No pericardial effusion. Though not tailored for evaluation, no central pulmonary embolism. MEDIASTINUM/NODES: No mediastinal mass or lymphadenopathy by CT size criteria. LUNGS/PLEURA: Tracheobronchial tree is patent, no pneumothorax. No pleural effusions, focal consolidations, pulmonary mass. Lingular atelectasis/scarring. 4 mm ground-glass nodule lingula, no routine indicated follow-up. This recommendation follows the consensus statement: Guidelines for Management of Small Pulmonary Nodules Detected on CT Images: From the Fleischner Society 2017; Radiology 2017; 284:228-243. MUSCULOSKELETAL: Non-suspicious. UPPER ABDOMEN: Nonacute. 16 mm homogeneously hypointense cyst or lymphangioma spleen. Hypodense liver most compatible with steatosis. Review of the MIP images confirms the above findings. IMPRESSION: 1. No acute vascular process. 2. Borderline cardiomegaly.  No acute pulmonary process. Electronically Signed   By: Awilda Metroourtnay  Bloomer M.D.   On: 07/16/2017 18:19   Ct Head Code Stroke Wo Contrast  Result Date: 07/16/2017 CLINICAL DATA:   Code stroke. Chest pain, LEFT arm pain beginning at 4:45 p.m. Assess for stroke. History of hypertension. EXAM: CT HEAD WITHOUT CONTRAST TECHNIQUE: Contiguous axial images were obtained from the base of the skull through the vertex without intravenous contrast. COMPARISON:  None. FINDINGS: Moderate motion degraded examination. Limited assessment of the posterior fossa. BRAIN: No intraparenchymal hemorrhage, mass effect nor midline shift. The ventricles and sulci are normal. No acute large vascular territory infarcts. No abnormal extra-axial fluid collections. Basal cisterns are patent. VASCULAR: Unremarkable. SKULL/SOFT TISSUES: No skull fracture. No significant soft tissue swelling. ORBITS/SINUSES: The included ocular globes and orbital contents are normal.The mastoid aircells and included paranasal sinuses are well-aerated. OTHER: None. ASPECTS [facility]([place] Stroke Program Early CT Score) - Ganglionic level infarction (caudate, lentiform nuclei, internal capsule, insula, M1-M3 cortex): 7 - Supraganglionic infarction (M4-M6 cortex): 3 Total score (0-10 with 10 being normal): 10 IMPRESSION: 1. No acute intracranial process on this moderately motion degraded examination. 2. ASPECTS is 10. 3. Critical Value/emergent results were called by telephone at the time of interpretation on 07/16/2017 at 5:39 pm to Dr. Linwood DibblesJON KNAPP , who verbally acknowledged these results. Electronically Signed   By: Awilda Metroourtnay  Bloomer M.D.   On: 07/16/2017 17:40        Scheduled Meds: . aspirin EC  81 mg Oral Daily  . cloNIDine  0.2 mg Oral Once  . cloNIDine  0.2 mg Oral QID  . enoxaparin (LOVENOX) injection  40 mg Subcutaneous Q24H  . gabapentin  400 mg Oral QID  . mirtazapine  15 mg Oral QHS  . ondansetron (ZOFRAN) IV  4 mg Intravenous Once  . pantoprazole  40 mg Oral Daily  . venlafaxine XR  450 mg Oral Q breakfast   And  . venlafaxine XR  150  mg Oral QHS   Continuous Infusions: . 0.9 % NaCl with KCl 40 mEq / L 125 mL/hr  (07/17/17 1231)     LOS: 0 days    Time spent:    Erick Blinks, MD [facility] Pager [phone]  If 7PM-7AM, please contact night-coverage [email or web address] Password [facility] 07/17/2017, 6:54 PM

## 2017-07-18 ENCOUNTER — Observation Stay (HOSPITAL_COMMUNITY): Payer: Non-veteran care

## 2017-07-18 DIAGNOSIS — F988 Other specified behavioral and emotional disorders with onset usually occurring in childhood and adolescence: Secondary | ICD-10-CM | POA: Diagnosis not present

## 2017-07-18 DIAGNOSIS — R29898 Other symptoms and signs involving the musculoskeletal system: Secondary | ICD-10-CM | POA: Diagnosis not present

## 2017-07-18 DIAGNOSIS — R079 Chest pain, unspecified: Secondary | ICD-10-CM | POA: Diagnosis not present

## 2017-07-18 DIAGNOSIS — I10 Essential (primary) hypertension: Secondary | ICD-10-CM | POA: Diagnosis not present

## 2017-07-18 LAB — SEDIMENTATION RATE: SED RATE: 3 mm/h (ref 0–16)

## 2017-07-18 LAB — HIV ANTIBODY (ROUTINE TESTING W REFLEX): HIV Screen 4th Generation wRfx: NONREACTIVE

## 2017-07-18 LAB — TSH: TSH: 1.005 u[IU]/mL (ref 0.350–4.500)

## 2017-07-18 LAB — C-REACTIVE PROTEIN: CRP: <0.8 mg/dL (ref <1.0)

## 2017-07-18 MED ORDER — GADOBENATE DIMEGLUMINE 529 MG/ML IV SOLN
18.0000 mL | Freq: Once | INTRAVENOUS | Status: AC | PRN
  Administered 2017-07-18: 18 mL via INTRAVENOUS

## 2017-07-18 NOTE — Progress Notes (Signed)
Physical Therapy Treatment Patient Details Name: Andrew Donaldson MRN: [id number] DOB: 1981/01/26 Today's Date: 07/18/2017    History of Present Illness Andrew Donaldson is a 37 y.o. male with medical history significant of ADD, anxiety, PTSD, hypertension, or lithiasis, history of 2 ureteral stent placement who is coming to the emergency department due to chest pain, left upper extremity numbness and weakness since earlier today.  He denies blurred vision, gait impairment, slurred speech or language comprehension and ability.  No fever, chills, sore throat, wheezing, hemoptysis, dyspnea, chest pain, palpitations, PND, orthopnea or pitting edema lower extremities.  No nausea, emesis, diarrhea constipation.  Denies dysuria, frequency or hematuria.  Denies skin rashes.  No polyuria, polydipsia or polyphagia.    PT Comments    Patient tends to hang onto nearby objects for support when ambulating in room and hallways, improvement in balance noted when using SPC with mostly 2 point gait pattern without loss of balance, but continues to reach for side rails and walls possibly due to fearful of falling more than balance deficits.  Patient will benefit from continued physical therapy in hospital and recommended venue below to increase strength, balance, endurance for safe ADLs and gait.    Follow Up Recommendations  Outpatient PT     Equipment Recommendations  Cane    Recommendations for Other Services       Precautions / Restrictions Precautions Precautions: Fall Restrictions Weight Bearing Restrictions: No    Mobility  Bed Mobility Overal bed mobility: Modified Independent                Transfers Overall transfer level: Modified independent                  Ambulation/Gait Ambulation/Gait assistance: Supervision Gait Distance (Feet): 40 Feet Assistive device: Straight cane Gait Pattern/deviations: Decreased step length - left;Decreased stance time - right;Decreased  stride length Gait velocity: near normal   General Gait Details: demonstrates labored cadence without loss of balance, frequent leaning on nearby objects for support using SPC with mostly 2 point gait pattern without loss of balance   Stairs             Wheelchair Mobility    Modified Rankin (Stroke Patients Only)       Balance Overall balance assessment: Mild deficits observed, not formally tested                                          Cognition Arousal/Alertness: Awake/alert Behavior During Therapy: WFL for tasks assessed/performed Overall Cognitive Status: Within Functional Limits for tasks assessed                                        Exercises      General Comments        Pertinent Vitals/Pain Pain Assessment: 0-10 Pain Score: 7  Pain Location: low back, left side of neck Pain Descriptors / Indicators: Aching;Discomfort Pain Intervention(s): Limited activity within patient's tolerance;Monitored during session    Home Living     Available Help at Discharge: Family;Friend(s);Available PRN/intermittently Type of Home: House              Prior Function            PT Goals (current goals can now be found in the  care plan section) Acute Rehab PT Goals Patient Stated Goal: return home PT Goal Formulation: With patient Time For Goal Achievement: 07/21/17 Potential to Achieve Goals: Good Progress towards PT goals: Progressing toward goals    Frequency    7X/week      PT Plan Current plan remains appropriate    Co-evaluation              AM-PAC PT "6 Clicks" Daily Activity  Outcome Measure  Difficulty turning over in bed (including adjusting bedclothes, sheets and blankets)?: None Difficulty moving from lying on back to sitting on the side of the bed? : None Difficulty sitting down on and standing up from a chair with arms (e.g., wheelchair, bedside commode, etc,.)?: None Help needed moving  to and from a bed to chair (including a wheelchair)?: None Help needed walking in hospital room?: A Little Help needed climbing 3-5 steps with a railing? : A Little 6 Click Score: 22    End of Session   Activity Tolerance: Patient limited by fatigue;Patient tolerated treatment well Patient left: in bed;with call bell/phone within reach Nurse Communication: Mobility status PT Visit Diagnosis: Unsteadiness on feet (R26.81);Other abnormalities of gait and mobility (R26.89);Muscle weakness (generalized) (M62.81)     Time: [id number] PT Time Calculation (min) (ACUTE ONLY): 18 min  Charges:  $Therapeutic Activity: 8-22 mins                    G Codes:       4:02 PM, 07/18/17 Andrew Donaldson, MPT Physical Therapist with [facility] 336 [phone] office [phone] mobile phone
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
## 2017-07-18 NOTE — Progress Notes (Signed)
PROGRESS NOTE    Andrew Donaldson  ZOX:[id number] DOB: [birth year] DOA: 07/16/2017 PCP: [facility]    Brief Narrative:  37 year old male admitted to the hospital with chest pain and left sided weakness.  Reports weakness in his left arm and lower extremity.  He ruled out for ACS with negative cardiac markers.  MRI of the brain did not show any acute infarct, but did comment on microhemorrhages.  Neurology consulted.   Assessment & Plan:   Principal Problem:   Left arm weakness Active Problems:   Gastroenteritis   Hypertension   Anxiety   PTSD (post-traumatic stress disorder)   ADD (attention deficit disorder)   Chest pain   Abnormal EKG   1. Left arm weakness.  Work-up for CVA has been unrevealing.  No acute infarct noted on MRI.  He was noted to have evidence of microhemorrhages.  Neurology has been consulted.  Further MRI of C-spine and MRI with contrast of brain ordered to evaluate for possible underlying multiple sclerosis.  These imaging studies have been unrevealing.  Await further recommendations from neurology 2. Hypertension.  Patient is currently on clonidine.  Blood pressures currently stable.  On conversation with the patient, he reports being prescribed clonidine for PTSD. 3. PTSD.  He is also on Effexor and Remeron. 4. ADD.  Patient is prescribed Adderall.  Is currently on hold. 5. Chest pain.  Atypical.  Negative cardiac markers and echocardiogram.   DVT prophylaxis: Lovenox Code Status: Full code Family Communication: No family present Disposition Plan: Discharge home after neurology evaluation   Consultants:   Neurology  Procedures:     Antimicrobials:       Subjective: Continues to feel mildly weak on the left upper and lower extremities  Objective: Vitals:   07/18/17 0523 07/18/17 0923 07/18/17 1329 07/18/17 1723  BP: 95/60 113/68 112/68 114/70  Pulse: 80 81 63 78  Resp: 18 18 18 18   Temp: 98 F (36.7 C) (!) 97.5 F (36.4  C) 98.1 F (36.7 C) 98.1 F (36.7 C)  TempSrc:  Oral Oral Oral  SpO2: 100% 100% 98% 98%  Weight:      Height:        Intake/Output Summary (Last 24 hours) at 07/18/2017 1823 Last data filed at 07/18/2017 1745 Gross per 24 hour  Intake 1240 ml  Output 1000 ml  Net 240 ml   Filed Weights   07/16/17 1651 07/16/17 2123  Weight: 95.3 kg (210 lb) 89.4 kg (197 lb 3.2 oz)    Examination:  General exam: Alert, awake, oriented x 3 Respiratory system: Clear to auscultation. Respiratory effort normal. Cardiovascular system:RRR. No murmurs, rubs, gallops. Gastrointestinal system: Abdomen is nondistended, soft and nontender. No organomegaly or masses felt. Normal bowel sounds heard. Central nervous system: Alert and oriented.  Continues to have some mild weakness on the left upper and lower extremities Extremities: No C/C/E, +pedal pulses Skin: No rashes, lesions or ulcers Psychiatry: Judgement and insight appear normal. Mood & affect appropriate.   Data Reviewed: I have personally reviewed following labs and imaging studies  CBC: Recent Labs  Lab 07/16/17 1704 07/16/17 1723 07/17/17 0252  WBC 12.0*  --  10.5  NEUTROABS 8.2*  --  6.0  HGB 15.2 15.6 13.1  HCT 45.2 46.0 39.4  MCV 94.6  --  95.2  PLT 408*  --  329   Basic Metabolic Panel: Recent Labs  Lab 07/16/17 1704 07/16/17 1723 07/16/17 2023 07/17/17 0252  NA 141 141  --  139  K 3.0* 3.4*  --  4.4  CL 102 100*  --  110  CO2 28  --   --  25  GLUCOSE 61* 58*  --  92  BUN 10 10  --  10  CREATININE 0.87 0.80  --  0.77  CALCIUM 9.8  --   --  8.6*  MG  --   --  1.7  --   PHOS  --   --  3.8  --    GFR: Estimated Creatinine Clearance: 139.8 mL/min (by C-G formula based on SCr of 0.77 mg/dL). Liver Function Tests: Recent Labs  Lab 07/16/17 1704  AST 15  ALT 14*  ALKPHOS 94  BILITOT 0.3  PROT 7.8  ALBUMIN 4.4   No results for input(s): LIPASE, AMYLASE in the last 168 hours. No results for input(s): AMMONIA in  the last 168 hours. Coagulation Profile: Recent Labs  Lab 07/16/17 1704  INR 1.00   Cardiac Enzymes: Recent Labs  Lab 07/16/17 2023 07/17/17 0252 07/17/17 0925  TROPONINI <0.03 <0.03 <0.03   BNP (last 3 results) No results for input(s): PROBNP in the last 8760 hours. HbA1C: Recent Labs    07/16/17 1704  HGBA1C 5.1   CBG: Recent Labs  Lab 07/16/17 1656 07/16/17 2030  GLUCAP 89 112*   Lipid Profile: Recent Labs    07/17/17 0252  CHOL 109  HDL 31*  LDLCALC 58  TRIG 161102  CHOLHDL 3.5   Thyroid Function Tests: Recent Labs    07/17/17 2349  TSH 1.005   Anemia Panel: No results for input(s): VITAMINB12, FOLATE, FERRITIN, TIBC, IRON, RETICCTPCT in the last 72 hours. Sepsis Labs: No results for input(s): PROCALCITON, LATICACIDVEN in the last 168 hours.  No results found for this or any previous visit (from the past 240 hour(s)).       Radiology Studies: Mr Brain Wo Contrast  Result Date: 07/17/2017 CLINICAL DATA:  LEFT-sided weakness for 2 days.  TIA, initial exam. EXAM: MRI HEAD WITHOUT CONTRAST MRA HEAD WITHOUT CONTRAST TECHNIQUE: Multiplanar, multiecho pulse sequences of the brain and surrounding structures were obtained without intravenous contrast. Angiographic images of the head were obtained using MRA technique without contrast. COMPARISON:  CT head without contrast, CTA head neck 07/16/2017 FINDINGS: MRI HEAD FINDINGS Brain: No evidence for acute infarction, hemorrhage, mass lesion, hydrocephalus, or extra-axial fluid. Normal for age cerebral volume. Mild subcortical and periventricular T2 and FLAIR hyperintensities, nonspecific, but abnormal for age, likely chronic microvascular ischemic change. Vascular: Flow voids are maintained throughout the carotid, basilar, and vertebral arteries. Tiny foci of susceptibility, RIGHT greater than LEFT temporal/insular regions, see series 9, images 9-10, likely microbleeds related to chronic hypertension. Skull and upper  cervical spine: Unremarkable visualized calvarium, skullbase, and cervical vertebrae. Pituitary, pineal, cerebellar tonsils unremarkable. No upper cervical cord lesions. Sinuses/Orbits: No orbital masses or proptosis. Globes appear symmetric. Sinuses appear well aerated, without evidence for air-fluid level. Other: None. MRA HEAD FINDINGS The internal carotid arteries are widely patent. The basilar artery is widely patent with vertebrals codominant. No intracranial stenosis or aneurysm. IMPRESSION: Mild small vessel disease, with a few small scattered microbleeds, likely sequelae of hypertensive cerebrovascular disease. No acute intracranial findings. Negative MRA intracranial.  No stenosis or dissection. Electronically Signed   By: Elsie StainJohn T Curnes M.D.   On: 07/17/2017 09:05   Mr Brain W Contrast  Result Date: 07/18/2017 CLINICAL DATA:  Multiple sclerosis.  Left-sided weakness EXAM: MRI HEAD WITH CONTRAST TECHNIQUE: Multiplanar, multiecho pulse sequences of  the brain and surrounding structures were obtained with intravenous contrast. CONTRAST:  18mL MULTIHANCE GADOBENATE DIMEGLUMINE 529 MG/ML IV SOLN COMPARISON:  MRI head without contrast 07/17/2017 FINDINGS: Image quality degraded by mild motion Normal enhancement pattern. No enhancing white matter lesions. No mass lesion. Leptomeningeal enhancement normal Ventricle size normal.  No midline shift. IMPRESSION: Normal enhancement postcontrast administration. See unenhanced MRI head results from yesterday. Electronically Signed   By: Marlan Palau M.D.   On: 07/18/2017 12:28   Mr Cervical Spine W Wo Contrast  Result Date: 07/18/2017 CLINICAL DATA:  Demyelinating disease with spinal cord symptoms EXAM: MRI CERVICAL SPINE WITHOUT AND WITH CONTRAST TECHNIQUE: Multiplanar and multiecho pulse sequences of the cervical spine, to include the craniocervical junction and cervicothoracic junction, were obtained without and with intravenous contrast. CONTRAST:  18mL  MULTIHANCE GADOBENATE DIMEGLUMINE 529 MG/ML IV SOLN COMPARISON:  None. FINDINGS: Alignment: Normal Vertebrae: Normal Cord: Normal signal and morphology. No cord lesion. Normal spinal cord enhancement Posterior Fossa, vertebral arteries, paraspinal tissues: Negative Disc levels: C2-3: Mild facet degeneration C3-4: Mild facet degeneration C4-5: Moderate facet degeneration. Mild foraminal encroachment bilaterally. Spinal canal adequate in size C5-6: Shallow right paracentral disc protrusion and spurring appears chronic. Moderate facet hypertrophy bilaterally. Moderate right foraminal encroachment C6-7: Shallow right-sided disc protrusion and spurring. Mild spinal stenosis and mild right foraminal encroachment C7-T1: Negative IMPRESSION: Normal appearing spinal cord Multilevel degenerative changes as above. Electronically Signed   By: Marlan Palau M.D.   On: 07/18/2017 12:31   US Carotid Bilateral (at [facility] And [facility] Only)  Result Date: 07/17/2017 CLINICAL DATA:  37 year old male with left upper extremity weakness for 1 day EXAM: BILATERAL CAROTID DUPLEX ULTRASOUND TECHNIQUE: Wallace Cullens scale imaging, color Doppler and duplex ultrasound were performed of bilateral carotid and vertebral arteries in the neck. COMPARISON:  None. FINDINGS: Criteria: Quantification of carotid stenosis is based on velocity parameters that correlate the residual internal carotid diameter with NASCET-based stenosis levels, using the diameter of the distal internal carotid lumen as the denominator for stenosis measurement. The following velocity measurements were obtained: RIGHT ICA: 141/31 cm/sec CCA: 113/30 cm/sec SYSTOLIC ICA/CCA RATIO:  1.25 ECA:  187 cm/sec LEFT ICA: 81/25 cm/sec CCA: 114/31 cm/sec SYSTOLIC ICA/CCA RATIO:  1.0 ECA:  115 cm/sec RIGHT CAROTID ARTERY: While there is elevation of the peak systolic velocity in the proximal internal carotid artery, there is no evidence of underlying atherosclerotic plaque. Color flow fills the vessel  lumen from wall to wall. The elevated peak systolic velocity is almost certainly spurious. RIGHT VERTEBRAL ARTERY:  Patent with normal antegrade flow. LEFT CAROTID ARTERY: No evidence of atherosclerotic plaque or stenosis. LEFT VERTEBRAL ARTERY:  Patent with normal antegrade flow. IMPRESSION: No evidence of internal carotid artery atherosclerotic plaque or significant stenosis. Both vertebral arteries are patent with normal antegrade flow. Signed, Sterling Big, MD [facility] Electronically Signed   By: Malachy Moan M.D.   On: 07/17/2017 09:15   Mr Maxine Glenn Head/brain ZO Cm  Result Date: 07/17/2017 CLINICAL DATA:  LEFT-sided weakness for 2 days.  TIA, initial exam. EXAM: MRI HEAD WITHOUT CONTRAST MRA HEAD WITHOUT CONTRAST TECHNIQUE: Multiplanar, multiecho pulse sequences of the brain and surrounding structures were obtained without intravenous contrast. Angiographic images of the head were obtained using MRA technique without contrast. COMPARISON:  CT head without contrast, CTA head neck 07/16/2017 FINDINGS: MRI HEAD FINDINGS Brain: No evidence for acute infarction, hemorrhage, mass lesion, hydrocephalus, or extra-axial fluid. Normal for age cerebral volume. Mild subcortical  and periventricular T2 and FLAIR hyperintensities, nonspecific, but abnormal for age, likely chronic microvascular ischemic change. Vascular: Flow voids are maintained throughout the carotid, basilar, and vertebral arteries. Tiny foci of susceptibility, RIGHT greater than LEFT temporal/insular regions, see series 9, images 9-10, likely microbleeds related to chronic hypertension. Skull and upper cervical spine: Unremarkable visualized calvarium, skullbase, and cervical vertebrae. Pituitary, pineal, cerebellar tonsils unremarkable. No upper cervical cord lesions. Sinuses/Orbits: No orbital masses or proptosis. Globes appear symmetric. Sinuses appear well aerated, without  evidence for air-fluid level. Other: None. MRA HEAD FINDINGS The internal carotid arteries are widely patent. The basilar artery is widely patent with vertebrals codominant. No intracranial stenosis or aneurysm. IMPRESSION: Mild small vessel disease, with a few small scattered microbleeds, likely sequelae of hypertensive cerebrovascular disease. No acute intracranial findings. Negative MRA intracranial.  No stenosis or dissection. Electronically Signed   By: Elsie Stain M.D.   On: 07/17/2017 09:05        Scheduled Meds: . aspirin EC  81 mg Oral Daily  . cloNIDine  0.2 mg Oral Once  . cloNIDine  0.2 mg Oral QID  . enoxaparin (LOVENOX) injection  40 mg Subcutaneous Q24H  . gabapentin  400 mg Oral QID  . mirtazapine  15 mg Oral QHS  . ondansetron (ZOFRAN) IV  4 mg Intravenous Once  . pantoprazole  40 mg Oral Daily  . venlafaxine XR  450 mg Oral Q breakfast   And  . venlafaxine XR  150 mg Oral QHS   Continuous Infusions: . 0.9 % NaCl with KCl 40 mEq / L 125 mL/hr (07/18/17 1049)     LOS: 0 days    Time spent:    Erick Blinks, MD [facility] Pager [phone]  If 7PM-7AM, please contact night-coverage [email or web address] Password [email or web address] 07/18/2017, 6:23 PM

## 2017-07-18 NOTE — Care Management Note (Signed)
Case Management Note  Patient Details  Name: Andrew Donaldson MRN: [id number] Date of Birth: [birth year]  Subjective/Objective:     Admitted with weakness. Undergoing CVA/MS work up.                Action/Plan: PT recommends cane and OP PT. Pt would like PT in [place], referral sent to [place] rehab in [place]. [facility] rep, aware of need for cane and will deliver to pt room. CM has [facility] to notify of admission. CM has left VM [facility] with no response. CM will cont to try and make contact and to fax pt info when appropriate.   Expected Discharge Date:    07/19/2017              Expected Discharge Plan:  Home/Self Care  In-House Referral:  NA  Discharge planning Services  CM Consult  Post Acute Care Choice:  Durable Medical Equipment Choice offered to:  Patient  DME Arranged:  Gilmer Morane DME Agency:  [facility]  Status of Service:  Completed, signed off  If discussed at Long Length of Stay Meetings, dates discussed:    Additional Comments:  Malcolm MetroChild[place], Nicholad Kautzman Demske, RN 07/18/2017, 10:04 AM

## 2017-07-18 NOTE — Care Management (Signed)
CM spoke with Gwynneth AlimentMarlon, SW at [facility] for Dr. Haig ProphetBeers. CM has provided notes, radiology reports and OP PT referral via fax to [phone]. CM will cont to provide updates and DC summary when available.

## 2017-07-19 DIAGNOSIS — F988 Other specified behavioral and emotional disorders with onset usually occurring in childhood and adolescence: Secondary | ICD-10-CM | POA: Diagnosis not present

## 2017-07-19 DIAGNOSIS — R29898 Other symptoms and signs involving the musculoskeletal system: Secondary | ICD-10-CM | POA: Diagnosis not present

## 2017-07-19 DIAGNOSIS — R079 Chest pain, unspecified: Secondary | ICD-10-CM | POA: Diagnosis not present

## 2017-07-19 DIAGNOSIS — F4310 Post-traumatic stress disorder, unspecified: Secondary | ICD-10-CM | POA: Diagnosis not present

## 2017-07-19 LAB — RPR: RPR: NONREACTIVE

## 2017-07-19 LAB — HOMOCYSTEINE: Homocysteine: 26.7 umol/L — ABNORMAL HIGH (ref 0.0–15.0)

## 2017-07-19 MED ORDER — ASPIRIN 81 MG PO TBEC: 81.0000 mg | DELAYED_RELEASE_TABLET | Freq: Every day | ORAL | Status: DC

## 2017-07-19 MED ORDER — CLONIDINE HCL 0.2 MG PO TABS: 0.2000 mg | ORAL_TABLET | Freq: Four times a day (QID) | ORAL | 0 refills | Status: DC

## 2017-07-19 NOTE — Progress Notes (Signed)
IV discontinued,catheter intact. Discharge instructions given on medications,and follow up visits,patient verbalized understanding. Accompanied by staff to an awaiting vehicle. 

## 2017-07-19 NOTE — Care Management (Signed)
[facility]

## 2017-07-19 NOTE — Progress Notes (Signed)
[facility] Andrew Donaldson A. Andrew Pilgrimoonquah, MD     [email or web address]          Andrew Donaldson is an 37 y.o. male.   Assessment/Plan:  1.  Left-sided numbness:   Imaging of the brain with and without contrast along with cervical spine failed to show etiology.  The patient may have peripheral neuropathy/radiculopathy which can be evaluated in the outpatient setting with appropriate electrodiagnostic testing.  An outpatient EEG is also recommended.  Aspirin 81 mg a blood pressure control is recommended.  [facility]  2. HTN      He reports that he is doing fairly well.  He he is overall about the same with some numbness on the left side but no weakness is reported however.    GENERAL: He is drowsy but arousable.  HEENT: Normal   EXTREMITIES: No edema   BACK: Normal  SKIN: Normal by inspection.    MENTAL STATUS: Drowsy but easily arousable.  Speech, language and cognition are generally intact. Judgment and insight normal.   CRANIAL NERVES: Pupils are equal, round and reactive to light and accomodation; extra ocular movements are full, there is no significant nystagmus; visual fields are full; upper and lower facial muscles are normal in strength and symmetric, there is no flattening of the nasolabial folds; tongue is midline; uvula is midline; shoulder elevation is normal.  MOTOR: Normal tone, bulk and strength; no pronator drift.  COORDINATION: Left finger to nose is normal, right finger to nose is normal, No rest tremor; no intention tremor; no postural tremor; no bradykinesia.  SENSATION Diminished sensation to temperature light touch involving the left upper extremity, left facial region and left upper extremity.  Trunk is spared.       Objective: Vital signs in last 24 hours: Temp:  [97.4 F (36.3 C)-98.1 F (36.7 C)] 97.4 F (36.3 C) (06/14 0615) Pulse Rate:  [63-81] 63 (06/14 0615) Resp:  [16-20] 20  (06/14 0615) BP: (100-114)/(58-74) 109/72 (06/14 0615) SpO2:  [96 %-100 %] 99 % (06/14 0615)  Intake/Output from previous day: 06/13 0701 - 06/14 0700 In: 1240 [P.O.:1240] Out: 550 [Urine:550] Intake/Output this shift: No intake/output data recorded. Nutritional status:  Diet Order           Diet Heart Room service appropriate? Yes; Fluid consistency: Thin  Diet effective now           Lab Results: Results for orders placed or performed during the hospital encounter of 07/16/17 (from the past 48 hour(s))  Troponin I     Status: None   Collection Time: 07/17/17  9:25 AM  Result Value Ref Range   Troponin I <0.03 <0.03 ng/mL    Comment: Performed at [facility], [address] [place], [place] [address]  TSH     Status: None   Collection Time: 07/17/17 11:49 PM  Result Value Ref Range   TSH 1.005 0.350 - 4.500 uIU/mL    Comment: Performed by a 3rd Generation assay with a functional sensitivity of <=0.01 uIU/mL. Performed at [facility], [address] [place], [place] [address]   Sedimentation rate     Status: None   Collection Time: 07/17/17 11:49 PM  Result Value Ref Range   Sed Rate 3 0 - 16 mm/hr    Comment: Performed at [facility], [address] [place], [place] [address]  C-reactive protein     Status: None   Collection Time: 07/17/17 11:49  PM  Result Value Ref Range   CRP <0.8 <1.0 mg/dL    Comment: Performed at [facility], [address] [place], [place] [address]    Lipid Panel Recent Labs    07/17/17 0252  CHOL 109  TRIG 102  HDL 31*  CHOLHDL 3.5  VLDL 20  LDLCALC 58    Studies/Results:  C SPINE MRI  FINDINGS: Alignment: Normal  Vertebrae: Normal  Cord: Normal signal and morphology. No cord lesion. Normal spinal cord enhancement  Posterior Fossa, vertebral arteries, paraspinal tissues: Negative  Disc levels:  C2-3: Mild facet degeneration  C3-4: Mild facet degeneration  C4-5: Moderate facet degeneration. Mild  foraminal encroachment bilaterally. Spinal canal adequate in size  C5-6: Shallow right paracentral disc protrusion and spurring appears chronic. Moderate facet hypertrophy bilaterally. Moderate right foraminal encroachment  C6-7: Shallow right-sided disc protrusion and spurring. Mild spinal stenosis and mild right foraminal encroachment  C7-T1: Negative  IMPRESSION: Normal appearing spinal cord  Multilevel degenerative changes as above.    BRAIN W  FINDINGS: Image quality degraded by mild motion  Normal enhancement pattern. No enhancing white matter lesions. No mass lesion. Leptomeningeal enhancement normal  Ventricle size normal.  No midline shift.  IMPRESSION: Normal enhancement postcontrast administration. See unenhanced MRI head results from yesterday.      Medications:  Scheduled Meds: . aspirin EC  81 mg Oral Daily  . cloNIDine  0.2 mg Oral Once  . cloNIDine  0.2 mg Oral QID  . enoxaparin (LOVENOX) injection  40 mg Subcutaneous Q24H  . gabapentin  400 mg Oral QID  . mirtazapine  15 mg Oral QHS  . ondansetron (ZOFRAN) IV  4 mg Intravenous Once  . pantoprazole  40 mg Oral Daily  . venlafaxine XR  450 mg Oral Q breakfast   And  . venlafaxine XR  150 mg Oral QHS   Continuous Infusions: . 0.9 % NaCl with KCl 40 mEq / L 125 mL/hr (07/18/17 1049)   PRN Meds:.acetaminophen **OR** acetaminophen, ALPRAZolam, ondansetron **OR** ondansetron (ZOFRAN) IV     LOS: 0 days   Andrew Donaldson A. Andrew Donaldson, M.D.  Diplomate, Biomedical engineer of Psychiatry and Neurology ( Neurology).

## 2017-07-19 NOTE — Discharge Summary (Signed)
Physician Discharge Summary  Andrew Donaldson ZOX:[id number] DOB: [birth year] DOA: 07/16/2017  PCP: [facility]  Admit date: 07/16/2017 Discharge date: 07/19/2017  Admitted From: Home Disposition: Home  Recommendations for Outpatient Follow-up:  1. Follow up with PCP in 1-2 weeks 2. Please obtain BMP/CBC in one week 3. Follow-up with neurology at the [facility] to be considered for further outpatient work-up including nerve conduction studies and possible EEG. 4. Patient has been set up with outpatient PT.  Discharge Condition: Stable CODE STATUS: Full code Diet recommendation: Heart healthy  Brief/Interim Summary: 37 year old male admitted to the hospital with chest pain left-sided weakness.  Reported weakness in his left arm and lower extremity.  He ruled out for ACS with negative cardiac markers.  MRI of the brain did not show any acute infarct.  Neurology was consulted and he underwent MRI of the C-spine.  Both MRI brain and C-spine did not show any evidence of multiple sclerosis.  It was felt that his symptoms may be related to more of a peripheral neuropathy versus radiculopathy.  It was felt that further work-up could be conducted as an outpatient.  He has been advised to follow-up with the [facility] neurology for further work-up including nerve conduction studies and possible EEG.  It is also been recommended to continue on aspirin for now.  Patient is otherwise in stable condition.  Discharge Diagnoses:  Principal Problem:   Left arm weakness Active Problems:   Gastroenteritis   Hypertension   Anxiety   PTSD (post-traumatic stress disorder)   ADD (attention deficit disorder)   Chest pain   Abnormal EKG    Discharge Instructions  Discharge Instructions    Ambulatory referral to Physical Therapy   Complete by:  As directed    Diet - low sodium heart healthy   Complete by:  As directed    Increase activity slowly   Complete by:  As directed      Allergies as of  07/19/2017      Reactions   Penicillins Anaphylaxis   Has patient had a PCN reaction causing immediate rash, facial/tongue/throat swelling, SOB or lightheadedness with hypotension: Yes Has patient had a PCN reaction causing severe rash involving mucus membranes or skin necrosis: Yes Has patient had a PCN reaction that required hospitalization No Has patient had a PCN reaction occurring within the last 10 years: No If all of the above answers are "NO", then may proceed with Cephalosporin use.      Medication List    STOP taking these medications   predniSONE 20 MG tablet Commonly known as:  DELTASONE     TAKE these medications   amphetamine-dextroamphetamine 20 MG tablet Commonly known as:  ADDERALL Take 40 mg by mouth 2 (two) times daily.   aspirin 81 MG EC tablet Take 1 tablet (81 mg total) by mouth daily. Start taking on:  07/20/2017   cloNIDine 0.2 MG tablet Commonly known as:  CATAPRES Take 1 tablet (0.2 mg total) by mouth 4 (four) times daily.   gabapentin 400 MG capsule Commonly known as:  NEURONTIN Take 400 mg by mouth 4 (four) times daily.   mirtazapine 15 MG tablet Commonly known as:  REMERON Take 15 mg by mouth at bedtime.   omeprazole 20 MG capsule Commonly known as:  PRILOSEC Take 1 capsule (20 mg total) by mouth daily. Take one tablet daily   venlafaxine XR 150 MG 24 hr capsule Commonly known as:  EFFEXOR-XR Take 150-450 mg by mouth See admin instructions.  3 capsules in the morning and 1 at bedtime            Durable Medical Equipment  (From admission, onward)        Start     Ordered   07/17/17 1425  For home use only DME Cane  Once     07/17/17 1424      Allergies  Allergen Reactions  . Penicillins Anaphylaxis    Has patient had a PCN reaction causing immediate rash, facial/tongue/throat swelling, SOB or lightheadedness with hypotension: Yes Has patient had a PCN reaction causing severe rash involving mucus membranes or skin necrosis:  Yes Has patient had a PCN reaction that required hospitalization No Has patient had a PCN reaction occurring within the last 10 years: No If all of the above answers are "NO", then may proceed with Cephalosporin use.     Consultations:  Neurology   Procedures/Studies: Ct Angio Head W Or Wo Contrast  Result Date: 07/16/2017 CLINICAL DATA:  Follow up code stroke.  LEFT arm numbness. EXAM: CT ANGIOGRAPHY HEAD AND NECK TECHNIQUE: Multidetector CT imaging of the head and neck was performed using the standard protocol during bolus administration of intravenous contrast. Multiplanar CT image reconstructions and MIPs were obtained to evaluate the vascular anatomy. Carotid stenosis measurements (when applicable) are obtained utilizing NASCET criteria, using the distal internal carotid diameter as the denominator. CONTRAST:  ISOVUE-370 IOPAMIDOL (ISOVUE-370) INJECTION 76% COMPARISON:  CT HEAD July 16, 2017 FINDINGS: CTA NECK FINDINGS: AORTIC ARCH: Normal appearance of the thoracic arch, normal branch pattern. The origins of the innominate, left Common carotid artery and subclavian artery are widely patent. RIGHT CAROTID SYSTEM: Common carotid artery is patent. Normal appearance of the carotid bifurcation without hemodynamically significant stenosis by NASCET criteria. Normal appearance of the internal carotid artery. LEFT CAROTID SYSTEM: Common carotid artery is patent. Normal appearance of the carotid bifurcation without hemodynamically significant stenosis by NASCET criteria. Normal appearance of the internal carotid artery. VERTEBRAL ARTERIES:Codominant vertebral arteries. Normal appearance of the vertebral arteries, widely patent. SKELETON: No acute osseous process though bone windows have not been submitted. Mild spondylosis facet arthropathy. Mild RIGHT C3-4 through C5-6 neural foraminal narrowing. OTHER NECK: Soft tissues of the neck are nonacute though, not tailored for evaluation. UPPER CHEST:  Included lung apices are clear. No superior mediastinal lymphadenopathy. CTA HEAD FINDINGS: ANTERIOR CIRCULATION: Patent cervical internal carotid arteries, petrous, cavernous and supra clinoid internal carotid arteries. Patent anterior communicating artery. Patent anterior and middle cerebral arteries. No large vessel occlusion, significant stenosis, contrast extravasation or aneurysm. POSTERIOR CIRCULATION: Patent vertebral arteries, vertebrobasilar junction and basilar artery, as well as main branch vessels. Patent posterior cerebral arteries. No large vessel occlusion, significant stenosis, contrast extravasation or aneurysm. VENOUS SINUSES: Major dural venous sinuses are patent though not tailored for evaluation on this angiographic examination. ANATOMIC VARIANTS: None. DELAYED PHASE: No abnormal intracranial enhancement. MIP images reviewed. IMPRESSION: 1. Normal CTA NECK. 2. Normal CTA HEAD. Electronically Signed   By: Awilda Metro M.D.   On: 07/16/2017 18:37   Ct Angio Neck W And/or Wo Contrast  Result Date: 07/16/2017 CLINICAL DATA:  Follow up code stroke.  LEFT arm numbness. EXAM: CT ANGIOGRAPHY HEAD AND NECK TECHNIQUE: Multidetector CT imaging of the head and neck was performed using the standard protocol during bolus administration of intravenous contrast. Multiplanar CT image reconstructions and MIPs were obtained to evaluate the vascular anatomy. Carotid stenosis measurements (when applicable) are obtained utilizing NASCET criteria, using the distal internal carotid diameter  as the denominator. CONTRAST:  ISOVUE-370 IOPAMIDOL (ISOVUE-370) INJECTION 76% COMPARISON:  CT HEAD July 16, 2017 FINDINGS: CTA NECK FINDINGS: AORTIC ARCH: Normal appearance of the thoracic arch, normal branch pattern. The origins of the innominate, left Common carotid artery and subclavian artery are widely patent. RIGHT CAROTID SYSTEM: Common carotid artery is patent. Normal appearance of the carotid bifurcation  without hemodynamically significant stenosis by NASCET criteria. Normal appearance of the internal carotid artery. LEFT CAROTID SYSTEM: Common carotid artery is patent. Normal appearance of the carotid bifurcation without hemodynamically significant stenosis by NASCET criteria. Normal appearance of the internal carotid artery. VERTEBRAL ARTERIES:Codominant vertebral arteries. Normal appearance of the vertebral arteries, widely patent. SKELETON: No acute osseous process though bone windows have not been submitted. Mild spondylosis facet arthropathy. Mild RIGHT C3-4 through C5-6 neural foraminal narrowing. OTHER NECK: Soft tissues of the neck are nonacute though, not tailored for evaluation. UPPER CHEST: Included lung apices are clear. No superior mediastinal lymphadenopathy. CTA HEAD FINDINGS: ANTERIOR CIRCULATION: Patent cervical internal carotid arteries, petrous, cavernous and supra clinoid internal carotid arteries. Patent anterior communicating artery. Patent anterior and middle cerebral arteries. No large vessel occlusion, significant stenosis, contrast extravasation or aneurysm. POSTERIOR CIRCULATION: Patent vertebral arteries, vertebrobasilar junction and basilar artery, as well as main branch vessels. Patent posterior cerebral arteries. No large vessel occlusion, significant stenosis, contrast extravasation or aneurysm. VENOUS SINUSES: Major dural venous sinuses are patent though not tailored for evaluation on this angiographic examination. ANATOMIC VARIANTS: None. DELAYED PHASE: No abnormal intracranial enhancement. MIP images reviewed. IMPRESSION: 1. Normal CTA NECK. 2. Normal CTA HEAD. Electronically Signed   By: Awilda Metro M.D.   On: 07/16/2017 18:37   Mr Brain Wo Contrast  Result Date: 07/17/2017 CLINICAL DATA:  LEFT-sided weakness for 2 days.  TIA, initial exam. EXAM: MRI HEAD WITHOUT CONTRAST MRA HEAD WITHOUT CONTRAST TECHNIQUE: Multiplanar, multiecho pulse sequences of the brain and  surrounding structures were obtained without intravenous contrast. Angiographic images of the head were obtained using MRA technique without contrast. COMPARISON:  CT head without contrast, CTA head neck 07/16/2017 FINDINGS: MRI HEAD FINDINGS Brain: No evidence for acute infarction, hemorrhage, mass lesion, hydrocephalus, or extra-axial fluid. Normal for age cerebral volume. Mild subcortical and periventricular T2 and FLAIR hyperintensities, nonspecific, but abnormal for age, likely chronic microvascular ischemic change. Vascular: Flow voids are maintained throughout the carotid, basilar, and vertebral arteries. Tiny foci of susceptibility, RIGHT greater than LEFT temporal/insular regions, see series 9, images 9-10, likely microbleeds related to chronic hypertension. Skull and upper cervical spine: Unremarkable visualized calvarium, skullbase, and cervical vertebrae. Pituitary, pineal, cerebellar tonsils unremarkable. No upper cervical cord lesions. Sinuses/Orbits: No orbital masses or proptosis. Globes appear symmetric. Sinuses appear well aerated, without evidence for air-fluid level. Other: None. MRA HEAD FINDINGS The internal carotid arteries are widely patent. The basilar artery is widely patent with vertebrals codominant. No intracranial stenosis or aneurysm. IMPRESSION: Mild small vessel disease, with a few small scattered microbleeds, likely sequelae of hypertensive cerebrovascular disease. No acute intracranial findings. Negative MRA intracranial.  No stenosis or dissection. Electronically Signed   By: Elsie Stain M.D.   On: 07/17/2017 09:05   Mr Brain W Contrast  Result Date: 07/18/2017 CLINICAL DATA:  Multiple sclerosis.  Left-sided weakness EXAM: MRI HEAD WITH CONTRAST TECHNIQUE: Multiplanar, multiecho pulse sequences of the brain and surrounding structures were obtained with intravenous contrast. CONTRAST:  18mL MULTIHANCE GADOBENATE DIMEGLUMINE 529 MG/ML IV SOLN COMPARISON:  MRI head without  contrast 07/17/2017 FINDINGS: Image quality degraded by  mild motion Normal enhancement pattern. No enhancing white matter lesions. No mass lesion. Leptomeningeal enhancement normal Ventricle size normal.  No midline shift. IMPRESSION: Normal enhancement postcontrast administration. See unenhanced MRI head results from yesterday. Electronically Signed   By: Marlan Palauharles  Clark M.D.   On: 07/18/2017 12:28   Mr Cervical Spine W Wo Contrast  Result Date: 07/18/2017 CLINICAL DATA:  Demyelinating disease with spinal cord symptoms EXAM: MRI CERVICAL SPINE WITHOUT AND WITH CONTRAST TECHNIQUE: Multiplanar and multiecho pulse sequences of the cervical spine, to include the craniocervical junction and cervicothoracic junction, were obtained without and with intravenous contrast. CONTRAST:  18mL MULTIHANCE GADOBENATE DIMEGLUMINE 529 MG/ML IV SOLN COMPARISON:  None. FINDINGS: Alignment: Normal Vertebrae: Normal Cord: Normal signal and morphology. No cord lesion. Normal spinal cord enhancement Posterior Fossa, vertebral arteries, paraspinal tissues: Negative Disc levels: C2-3: Mild facet degeneration C3-4: Mild facet degeneration C4-5: Moderate facet degeneration. Mild foraminal encroachment bilaterally. Spinal canal adequate in size C5-6: Shallow right paracentral disc protrusion and spurring appears chronic. Moderate facet hypertrophy bilaterally. Moderate right foraminal encroachment C6-7: Shallow right-sided disc protrusion and spurring. Mild spinal stenosis and mild right foraminal encroachment C7-T1: Negative IMPRESSION: Normal appearing spinal cord Multilevel degenerative changes as above. Electronically Signed   By: Marlan Palauharles  Clark M.D.   On: 07/18/2017 12:31   [place] Carotid Bilateral (at [facility] And [facility] Only)  Result Date: 07/17/2017 CLINICAL DATA:  37 year old male with left upper extremity weakness for 1 day EXAM: BILATERAL CAROTID DUPLEX ULTRASOUND TECHNIQUE: Wallace CullensGray scale imaging, color Doppler and duplex ultrasound were  performed of bilateral carotid and vertebral arteries in the neck. COMPARISON:  None. FINDINGS: Criteria: Quantification of carotid stenosis is based on velocity parameters that correlate the residual internal carotid diameter with NASCET-based stenosis levels, using the diameter of the distal internal carotid lumen as the denominator for stenosis measurement. The following velocity measurements were obtained: RIGHT ICA: 141/31 cm/sec CCA: 113/30 cm/sec SYSTOLIC ICA/CCA RATIO:  1.25 ECA:  187 cm/sec LEFT ICA: 81/25 cm/sec CCA: 114/31 cm/sec SYSTOLIC ICA/CCA RATIO:  1.0 ECA:  115 cm/sec RIGHT CAROTID ARTERY: While there is elevation of the peak systolic velocity in the proximal internal carotid artery, there is no evidence of underlying atherosclerotic plaque. Color flow fills the vessel lumen from wall to wall. The elevated peak systolic velocity is almost certainly spurious. RIGHT VERTEBRAL ARTERY:  Patent with normal antegrade flow. LEFT CAROTID ARTERY: No evidence of atherosclerotic plaque or stenosis. LEFT VERTEBRAL ARTERY:  Patent with normal antegrade flow. IMPRESSION: No evidence of internal carotid artery atherosclerotic plaque or significant stenosis. Both vertebral arteries are patent with normal antegrade flow. Signed, Sterling BigHeath K. McCullough, MD [facility] Electronically Signed   By: Malachy MoanHeath  McCullough M.D.   On: 07/17/2017 09:15   Mr Maxine GlennMra Head/brain WUWo Cm  Result Date: 07/17/2017 CLINICAL DATA:  LEFT-sided weakness for 2 days.  TIA, initial exam. EXAM: MRI HEAD WITHOUT CONTRAST MRA HEAD WITHOUT CONTRAST TECHNIQUE: Multiplanar, multiecho pulse sequences of the brain and surrounding structures were obtained without intravenous contrast. Angiographic images of the head were obtained using MRA technique without contrast. COMPARISON:  CT head without contrast, CTA head neck 07/16/2017 FINDINGS: MRI HEAD FINDINGS Brain: No evidence for acute  infarction, hemorrhage, mass lesion, hydrocephalus, or extra-axial fluid. Normal for age cerebral volume. Mild subcortical and periventricular T2 and FLAIR hyperintensities, nonspecific, but abnormal for age, likely chronic microvascular ischemic change. Vascular: Flow voids are maintained throughout the carotid, basilar, and vertebral arteries. Tiny foci of susceptibility,  RIGHT greater than LEFT temporal/insular regions, see series 9, images 9-10, likely microbleeds related to chronic hypertension. Skull and upper cervical spine: Unremarkable visualized calvarium, skullbase, and cervical vertebrae. Pituitary, pineal, cerebellar tonsils unremarkable. No upper cervical cord lesions. Sinuses/Orbits: No orbital masses or proptosis. Globes appear symmetric. Sinuses appear well aerated, without evidence for air-fluid level. Other: None. MRA HEAD FINDINGS The internal carotid arteries are widely patent. The basilar artery is widely patent with vertebrals codominant. No intracranial stenosis or aneurysm. IMPRESSION: Mild small vessel disease, with a few small scattered microbleeds, likely sequelae of hypertensive cerebrovascular disease. No acute intracranial findings. Negative MRA intracranial.  No stenosis or dissection. Electronically Signed   By: Elsie Stain M.D.   On: 07/17/2017 09:05   Ct Angio Chest Aorta W And/or Wo Contrast  Result Date: 07/16/2017 CLINICAL DATA:  Complex chest pain. Assess for dissection or pulmonary embolism. History of hypertension, lithotripsy. EXAM: CT ANGIOGRAPHY CHEST WITH CONTRAST TECHNIQUE: Multidetector CT imaging of the chest was performed using the standard protocol during bolus administration of intravenous contrast. Multiplanar CT image reconstructions and MIPs were obtained to evaluate the vascular anatomy. CONTRAST:  ISOVUE-370 IOPAMIDOL (ISOVUE-370) INJECTION 76% COMPARISON:  Chest radiograph April 11, 2017 FINDINGS: CARDIOVASCULAR: Thoracic aorta is normal course and  caliber. No intrinsic density on noncontrast CT. Respiratory motion through ascending aorta. Homogeneous contrast opacification of thoracic aorta without dissection, aneurysm, luminal irregularity, periaortic fluid collections, or contrast extravasation. Heart size is normal. No pericardial effusion. Though not tailored for evaluation, no central pulmonary embolism. MEDIASTINUM/NODES: No mediastinal mass or lymphadenopathy by CT size criteria. LUNGS/PLEURA: Tracheobronchial tree is patent, no pneumothorax. No pleural effusions, focal consolidations, pulmonary mass. Lingular atelectasis/scarring. 4 mm ground-glass nodule lingula, no routine indicated follow-up. This recommendation follows the consensus statement: Guidelines for Management of Small Pulmonary Nodules Detected on CT Images: From the Fleischner Society 2017; Radiology 2017; 284:228-243. MUSCULOSKELETAL: Non-suspicious. UPPER ABDOMEN: Nonacute. 16 mm homogeneously hypointense cyst or lymphangioma spleen. Hypodense liver most compatible with steatosis. Review of the MIP images confirms the above findings. IMPRESSION: 1. No acute vascular process. 2. Borderline cardiomegaly.  No acute pulmonary process. Electronically Signed   By: Awilda Metro M.D.   On: 07/16/2017 18:19   Ct Head Code Stroke Wo Contrast  Result Date: 07/16/2017 CLINICAL DATA:  Code stroke. Chest pain, LEFT arm pain beginning at 4:45 p.m. Assess for stroke. History of hypertension. EXAM: CT HEAD WITHOUT CONTRAST TECHNIQUE: Contiguous axial images were obtained from the base of the skull through the vertex without intravenous contrast. COMPARISON:  None. FINDINGS: Moderate motion degraded examination. Limited assessment of the posterior fossa. BRAIN: No intraparenchymal hemorrhage, mass effect nor midline shift. The ventricles and sulci are normal. No acute large vascular territory infarcts. No abnormal extra-axial fluid collections. Basal cisterns are patent. VASCULAR:  Unremarkable. SKULL/SOFT TISSUES: No skull fracture. No significant soft tissue swelling. ORBITS/SINUSES: The included ocular globes and orbital contents are normal.The mastoid aircells and included paranasal sinuses are well-aerated. OTHER: None. ASPECTS [facility] Stroke Program Early CT Score) - Ganglionic level infarction (caudate, lentiform nuclei, internal capsule, insula, M1-M3 cortex): 7 - Supraganglionic infarction (M4-M6 cortex): 3 Total score (0-10 with 10 being normal): 10 IMPRESSION: 1. No acute intracranial process on this moderately motion degraded examination. 2. ASPECTS is 10. 3. Critical Value/emergent results were called by telephone at the time of interpretation on 07/16/2017 at 5:39 pm to Dr. Linwood Dibbles , who verbally acknowledged these results. Electronically Signed   By: Michel Santee.D.  On: 07/16/2017 17:40       Subjective: Overall feeling better. Weakness on left side is better although still has some numbness  Discharge Exam: Vitals:   07/18/17 2303 07/19/17 0615  BP: 108/74 109/72  Pulse: 69 63  Resp: 16 20  Temp: 97.8 F (36.6 C) (!) 97.4 F (36.3 C)  SpO2: 98% 99%   Vitals:   07/18/17 2005 07/18/17 2155 07/18/17 2303 07/19/17 0615  BP:  (!) 100/58 108/74 109/72  Pulse:   69 63  Resp:   16 20  Temp:   97.8 F (36.6 C) (!) 97.4 F (36.3 C)  TempSrc:    Oral  SpO2: 96%  98% 99%  Weight:      Height:        General: Pt is alert, awake, not in acute distress Cardiovascular: RRR, S1/S2 +, no rubs, no gallops Respiratory: CTA bilaterally, no wheezing, no rhonchi Abdominal: Soft, NT, ND, bowel sounds + Extremities: no edema, no cyanosis    The results of significant diagnostics from this hospitalization (including imaging, microbiology, ancillary and laboratory) are listed below for reference.     Microbiology: No results found for this or any previous visit (from the past 240 hour(s)).   Labs: BNP (last 3 results) No results for input(s):  BNP in the last 8760 hours. Basic Metabolic Panel: Recent Labs  Lab 07/16/17 1704 07/16/17 1723 07/16/17 2023 07/17/17 0252  NA 141 141  --  139  K 3.0* 3.4*  --  4.4  CL 102 100*  --  110  CO2 28  --   --  25  GLUCOSE 61* 58*  --  92  BUN 10 10  --  10  CREATININE 0.87 0.80  --  0.77  CALCIUM 9.8  --   --  8.6*  MG  --   --  1.7  --   PHOS  --   --  3.8  --    Liver Function Tests: Recent Labs  Lab 07/16/17 1704  AST 15  ALT 14*  ALKPHOS 94  BILITOT 0.3  PROT 7.8  ALBUMIN 4.4   No results for input(s): LIPASE, AMYLASE in the last 168 hours. No results for input(s): AMMONIA in the last 168 hours. CBC: Recent Labs  Lab 07/16/17 1704 07/16/17 1723 07/17/17 0252  WBC 12.0*  --  10.5  NEUTROABS 8.2*  --  6.0  HGB 15.2 15.6 13.1  HCT 45.2 46.0 39.4  MCV 94.6  --  95.2  PLT 408*  --  329   Cardiac Enzymes: Recent Labs  Lab 07/16/17 2023 07/17/17 0252 07/17/17 0925  TROPONINI <0.03 <0.03 <0.03   BNP: Invalid input(s): POCBNP CBG: Recent Labs  Lab 07/16/17 1656 07/16/17 2030  GLUCAP 89 112*   D-Dimer No results for input(s): DDIMER in the last 72 hours. Hgb A1c Recent Labs    07/16/17 1704  HGBA1C 5.1   Lipid Profile Recent Labs    07/17/17 0252  CHOL 109  HDL 31*  LDLCALC 58  TRIG 161  CHOLHDL 3.5   Thyroid function studies Recent Labs    07/17/17 2349  TSH 1.005   Anemia work up No results for input(s): VITAMINB12, FOLATE, FERRITIN, TIBC, IRON, RETICCTPCT in the last 72 hours. Urinalysis    Component Value Date/Time   COLORURINE YELLOW 07/16/2017 1704   APPEARANCEUR HAZY (A) 07/16/2017 1704   LABSPEC >1.046 (H) 07/16/2017 1704   PHURINE 7.0 07/16/2017 1704   GLUCOSEU NEGATIVE 07/16/2017 1704  HGBUR NEGATIVE 07/16/2017 1704   BILIRUBINUR NEGATIVE 07/16/2017 1704   KETONESUR NEGATIVE 07/16/2017 1704   PROTEINUR NEGATIVE 07/16/2017 1704   UROBILINOGEN 0.2 04/21/2014 0021   NITRITE NEGATIVE 07/16/2017 1704   LEUKOCYTESUR  NEGATIVE 07/16/2017 1704   Sepsis Labs Invalid input(s): PROCALCITONIN,  WBC,  LACTICIDVEN Microbiology No results found for this or any previous visit (from the past 240 hour(s)).   Time coordinating discharge:  SIGNED:   Erick Blinks, MD  [facility] 07/19/2017, 11:14 AM Pager   If 7PM-7AM, please contact night-coverage [email or web address] Password [email or web address]

## 2017-07-20 LAB — ANTINUCLEAR ANTIBODIES, IFA: ANA Ab, IFA: NEGATIVE

## 2017-07-30 ENCOUNTER — Other Ambulatory Visit: Payer: Self-pay

## 2017-07-30 ENCOUNTER — Emergency Department (HOSPITAL_COMMUNITY)
Admission: EM | Admit: 2017-07-30 | Discharge: 2017-07-30 | Disposition: A | Discharge status: Home / Self Care | Payer: Non-veteran care | Attending: Emergency Medicine | Admitting: Emergency Medicine

## 2017-07-30 ENCOUNTER — Encounter (HOSPITAL_COMMUNITY): Payer: Self-pay | Admitting: Emergency Medicine

## 2017-07-30 ENCOUNTER — Emergency Department (HOSPITAL_COMMUNITY): Payer: Non-veteran care

## 2017-07-30 DIAGNOSIS — S93402A Sprain of unspecified ligament of left ankle, initial encounter: Secondary | ICD-10-CM | POA: Insufficient documentation

## 2017-07-30 DIAGNOSIS — I10 Essential (primary) hypertension: Secondary | ICD-10-CM | POA: Diagnosis not present

## 2017-07-30 DIAGNOSIS — Y929 Unspecified place or not applicable: Secondary | ICD-10-CM | POA: Insufficient documentation

## 2017-07-30 DIAGNOSIS — Y998 Other external cause status: Secondary | ICD-10-CM | POA: Insufficient documentation

## 2017-07-30 DIAGNOSIS — F419 Anxiety disorder, unspecified: Secondary | ICD-10-CM | POA: Insufficient documentation

## 2017-07-30 DIAGNOSIS — Z79899 Other long term (current) drug therapy: Secondary | ICD-10-CM | POA: Diagnosis not present

## 2017-07-30 DIAGNOSIS — F17210 Nicotine dependence, cigarettes, uncomplicated: Secondary | ICD-10-CM | POA: Diagnosis not present

## 2017-07-30 DIAGNOSIS — F909 Attention-deficit hyperactivity disorder, unspecified type: Secondary | ICD-10-CM | POA: Diagnosis not present

## 2017-07-30 DIAGNOSIS — W091XXA Fall from playground swing, initial encounter: Secondary | ICD-10-CM | POA: Diagnosis not present

## 2017-07-30 DIAGNOSIS — S99912A Unspecified injury of left ankle, initial encounter: Secondary | ICD-10-CM | POA: Diagnosis present

## 2017-07-30 DIAGNOSIS — Y9389 Activity, other specified: Secondary | ICD-10-CM | POA: Diagnosis not present

## 2017-07-30 DIAGNOSIS — Z7982 Long term (current) use of aspirin: Secondary | ICD-10-CM | POA: Diagnosis not present

## 2017-07-30 MED ORDER — HYDROMORPHONE HCL 1 MG/ML IJ SOLN
1.0000 mg | Freq: Once | INTRAMUSCULAR | Status: AC
  Administered 2017-07-30: 1 mg via INTRAMUSCULAR
  Filled 2017-07-30: qty 1

## 2017-07-30 MED ORDER — OXYCODONE-ACETAMINOPHEN 5-325 MG PO TABS
1.0000 | ORAL_TABLET | Freq: Once | ORAL | Status: AC
  Administered 2017-07-30: 1 via ORAL
  Filled 2017-07-30: qty 1

## 2017-07-30 MED ORDER — ONDANSETRON 4 MG PO TBDP
4.0000 mg | ORAL_TABLET | Freq: Once | ORAL | Status: AC
  Administered 2017-07-30: 4 mg via ORAL
  Filled 2017-07-30: qty 1

## 2017-07-30 MED ORDER — OXYCODONE-ACETAMINOPHEN 7.5-325 MG PO TABS: 1.0000 | ORAL_TABLET | ORAL | 0 refills | Status: DC | PRN

## 2017-07-30 MED ORDER — IBUPROFEN 800 MG PO TABS: 800.0000 mg | ORAL_TABLET | Freq: Three times a day (TID) | ORAL | 0 refills | Status: DC
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
## 2017-07-30 NOTE — Discharge Instructions (Addendum)
Wear the ASO and use crutches to avoid weight bearing.  Use ice and elevation as much as possible for the next several days to help reduce the swelling.  Take the medications prescribed.  You may take the oxycodone prescribed for pain relief.  This will make you drowsy - do not drive within 4 hours of taking this medication.  Use the ibuprofen also for inflammation.  Call your doctor for a recheck of your injury in 1 week.  You may benefit from physical therapy of your ankle if the swelling and pain is not improving.

## 2017-07-30 NOTE — ED Triage Notes (Signed)
Patient complains of left ankle pain and swelling yesterday afternoon.

## 2017-10-01 ENCOUNTER — Emergency Department (HOSPITAL_COMMUNITY)
Admission: EM | Admit: 2017-10-01 | Discharge: 2017-10-01 | Disposition: A | Discharge status: Home / Self Care | Payer: Non-veteran care | Attending: Emergency Medicine | Admitting: Emergency Medicine

## 2017-10-01 ENCOUNTER — Emergency Department (HOSPITAL_COMMUNITY): Payer: Non-veteran care

## 2017-10-01 ENCOUNTER — Encounter (HOSPITAL_COMMUNITY): Payer: Self-pay | Admitting: Emergency Medicine

## 2017-10-01 ENCOUNTER — Other Ambulatory Visit: Payer: Self-pay

## 2017-10-01 DIAGNOSIS — Z7982 Long term (current) use of aspirin: Secondary | ICD-10-CM | POA: Diagnosis not present

## 2017-10-01 DIAGNOSIS — R0789 Other chest pain: Secondary | ICD-10-CM | POA: Diagnosis not present

## 2017-10-01 DIAGNOSIS — Z79899 Other long term (current) drug therapy: Secondary | ICD-10-CM | POA: Diagnosis not present

## 2017-10-01 DIAGNOSIS — R079 Chest pain, unspecified: Secondary | ICD-10-CM | POA: Diagnosis present

## 2017-10-01 DIAGNOSIS — I10 Essential (primary) hypertension: Secondary | ICD-10-CM | POA: Diagnosis not present

## 2017-10-01 DIAGNOSIS — F17210 Nicotine dependence, cigarettes, uncomplicated: Secondary | ICD-10-CM | POA: Diagnosis not present

## 2017-10-01 LAB — RAPID URINE DRUG SCREEN, HOSP PERFORMED
Amphetamines: NOT DETECTED
BENZODIAZEPINES: NOT DETECTED
Barbiturates: NOT DETECTED
Cocaine: NOT DETECTED
OPIATES: NOT DETECTED
TETRAHYDROCANNABINOL: NOT DETECTED

## 2017-10-01 LAB — BASIC METABOLIC PANEL
Anion gap: 7 (ref 5–15)
BUN: 12 mg/dL (ref 6–20)
CALCIUM: 9.3 mg/dL (ref 8.9–10.3)
CO2: 26 mmol/L (ref 22–32)
CREATININE: 0.83 mg/dL (ref 0.61–1.24)
Chloride: 104 mmol/L (ref 98–111)
GFR calc Af Amer: >60 mL/min (ref >60)
GFR calc non Af Amer: >60 mL/min (ref >60)
GLUCOSE: 117 mg/dL — AB (ref 70–99)
Potassium: 3.9 mmol/L (ref 3.5–5.1)
Sodium: 137 mmol/L (ref 135–145)

## 2017-10-01 LAB — I-STAT TROPONIN, ED
Troponin i, poc: 0 ng/mL (ref 0.00–0.08)
Troponin i, poc: 0 ng/mL (ref 0.00–0.08)

## 2017-10-01 LAB — CBC
HCT: 38.4 % — ABNORMAL LOW (ref 39.0–52.0)
HEMOGLOBIN: 13.1 g/dL (ref 13.0–17.0)
MCH: 32.8 pg (ref 26.0–34.0)
MCHC: 34.1 g/dL (ref 30.0–36.0)
MCV: 96.2 fL (ref 78.0–100.0)
Platelets: 319 10*3/uL (ref 150–400)
RBC: 3.99 MIL/uL — ABNORMAL LOW (ref 4.22–5.81)
RDW: 13.1 % (ref 11.5–15.5)
WBC: 8.2 10*3/uL (ref 4.0–10.5)

## 2017-10-01 LAB — HEPATIC FUNCTION PANEL
ALBUMIN: 4.1 g/dL (ref 3.5–5.0)
ALK PHOS: 96 U/L (ref 38–126)
ALT: 16 U/L (ref 0–44)
AST: 15 U/L (ref 15–41)
Bilirubin, Direct: <0.1 mg/dL (ref 0.0–0.2)
Total Bilirubin: 0.4 mg/dL (ref 0.3–1.2)
Total Protein: 7.4 g/dL (ref 6.5–8.1)

## 2017-10-01 MED ORDER — HYDROMORPHONE HCL 1 MG/ML IJ SOLN
0.5000 mg | Freq: Once | INTRAMUSCULAR | Status: AC
  Administered 2017-10-01: 0.5 mg via INTRAVENOUS
  Filled 2017-10-01: qty 1

## 2017-10-01 MED ORDER — LORAZEPAM 2 MG/ML IJ SOLN
1.0000 mg | Freq: Once | INTRAMUSCULAR | Status: AC
  Administered 2017-10-01: 1 mg via INTRAVENOUS
  Filled 2017-10-01: qty 1

## 2017-10-01 MED ORDER — LORAZEPAM 2 MG/ML IJ SOLN
0.5000 mg | Freq: Once | INTRAMUSCULAR | Status: AC
  Administered 2017-10-01: 0.5 mg via INTRAVENOUS
  Filled 2017-10-01: qty 1

## 2017-10-01 MED ORDER — HYDROXYZINE HCL 25 MG PO TABS: 25.0000 mg | ORAL_TABLET | Freq: Four times a day (QID) | ORAL | 0 refills | Status: DC | PRN
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
## 2017-10-01 NOTE — ED Triage Notes (Addendum)
Pt c/o of chest pain starting today.  2 nitros and ASA given en route.  Pt states not having any of his regular meds for a week. [facility]

## 2017-10-01 NOTE — Discharge Instructions (Addendum)
Follow-up with your family doctor as needed 

## 2017-10-10 ENCOUNTER — Other Ambulatory Visit: Payer: Self-pay

## 2017-10-10 ENCOUNTER — Emergency Department (HOSPITAL_COMMUNITY)
Admission: EM | Admit: 2017-10-10 | Discharge: 2017-10-10 | Attending: Emergency Medicine | Admitting: Emergency Medicine

## 2017-10-10 ENCOUNTER — Encounter (HOSPITAL_COMMUNITY): Payer: Self-pay | Admitting: Emergency Medicine

## 2017-10-10 ENCOUNTER — Emergency Department (HOSPITAL_COMMUNITY)

## 2017-10-10 DIAGNOSIS — I10 Essential (primary) hypertension: Secondary | ICD-10-CM | POA: Diagnosis not present

## 2017-10-10 DIAGNOSIS — W19XXXA Unspecified fall, initial encounter: Secondary | ICD-10-CM | POA: Insufficient documentation

## 2017-10-10 DIAGNOSIS — Y939 Activity, unspecified: Secondary | ICD-10-CM | POA: Diagnosis not present

## 2017-10-10 DIAGNOSIS — Y92149 Unspecified place in prison as the place of occurrence of the external cause: Secondary | ICD-10-CM | POA: Insufficient documentation

## 2017-10-10 DIAGNOSIS — Y999 Unspecified external cause status: Secondary | ICD-10-CM | POA: Insufficient documentation

## 2017-10-10 DIAGNOSIS — R55 Syncope and collapse: Secondary | ICD-10-CM

## 2017-10-10 DIAGNOSIS — F17210 Nicotine dependence, cigarettes, uncomplicated: Secondary | ICD-10-CM | POA: Insufficient documentation

## 2017-10-10 DIAGNOSIS — S0181XA Laceration without foreign body of other part of head, initial encounter: Secondary | ICD-10-CM | POA: Insufficient documentation

## 2017-10-10 DIAGNOSIS — Z79899 Other long term (current) drug therapy: Secondary | ICD-10-CM | POA: Diagnosis not present

## 2017-10-10 LAB — CBC WITH DIFFERENTIAL/PLATELET
Basophils Absolute: 0 10*3/uL (ref 0.0–0.1)
Basophils Relative: 0 %
Eosinophils Absolute: 0.1 10*3/uL (ref 0.0–0.7)
Eosinophils Relative: 1 %
HCT: 42.9 % (ref 39.0–52.0)
Hemoglobin: 14.4 g/dL (ref 13.0–17.0)
LYMPHS PCT: 28 %
Lymphs Abs: 2 10*3/uL (ref 0.7–4.0)
MCH: 33 pg (ref 26.0–34.0)
MCHC: 33.6 g/dL (ref 30.0–36.0)
MCV: 98.2 fL (ref 78.0–100.0)
MONOS PCT: 8 %
Monocytes Absolute: 0.6 10*3/uL (ref 0.1–1.0)
Neutro Abs: 4.5 10*3/uL (ref 1.7–7.7)
Neutrophils Relative %: 63 %
Platelets: 288 10*3/uL (ref 150–400)
RBC: 4.37 MIL/uL (ref 4.22–5.81)
RDW: 13 % (ref 11.5–15.5)
WBC: 7.2 10*3/uL (ref 4.0–10.5)

## 2017-10-10 LAB — BASIC METABOLIC PANEL
Anion gap: 7 (ref 5–15)
BUN: 15 mg/dL (ref 6–20)
CO2: 30 mmol/L (ref 22–32)
CREATININE: 1.02 mg/dL (ref 0.61–1.24)
Calcium: 9.3 mg/dL (ref 8.9–10.3)
Chloride: 102 mmol/L (ref 98–111)
GFR calc Af Amer: >60 mL/min (ref >60)
GFR calc non Af Amer: >60 mL/min (ref >60)
GLUCOSE: 93 mg/dL (ref 70–99)
Potassium: 4.7 mmol/L (ref 3.5–5.1)
Sodium: 139 mmol/L (ref 135–145)

## 2017-10-10 MED ORDER — ACETAMINOPHEN 500 MG PO TABS
1000.0000 mg | ORAL_TABLET | Freq: Once | ORAL | Status: AC
  Administered 2017-10-10: 1000 mg via ORAL
  Filled 2017-10-10: qty 2

## 2017-10-10 MED ORDER — LIDOCAINE-EPINEPHRINE (PF) 2 %-1:200000 IJ SOLN
10.0000 mL | Freq: Once | INTRAMUSCULAR | Status: AC
  Administered 2017-10-10: 10 mL
  Filled 2017-10-10: qty 20
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
## 2017-10-10 NOTE — Discharge Instructions (Signed)
Tests were all good.  Increase fluids.  Suture out in 7 days.

## 2017-10-10 NOTE — ED Triage Notes (Signed)
Pt states he loss consciousness 1230 today, falling on his chin.  Laceration to the bottom of chin. Bleeding controlled.  Pt states the nurse at facility states it may be caused by a blood pressure medication he is on .

## 2017-10-10 NOTE — ED Notes (Signed)
EDP at bedside  

## 2017-10-10 NOTE — ED Notes (Signed)
Phlebotomy at bedside.

## 2019-07-26 ENCOUNTER — Emergency Department (HOSPITAL_COMMUNITY): Payer: No Typology Code available for payment source

## 2019-07-26 ENCOUNTER — Inpatient Hospital Stay (HOSPITAL_COMMUNITY)
Admission: AD | Admit: 2019-07-26 | Discharge: 2019-07-29 | DRG: 155 | Disposition: A | Discharge status: Home / Self Care | Payer: No Typology Code available for payment source | Attending: Internal Medicine | Admitting: Internal Medicine

## 2019-07-26 ENCOUNTER — Other Ambulatory Visit: Payer: Self-pay

## 2019-07-26 ENCOUNTER — Encounter (HOSPITAL_COMMUNITY): Payer: Self-pay

## 2019-07-26 DIAGNOSIS — J340 Abscess, furuncle and carbuncle of nose: Principal | ICD-10-CM | POA: Diagnosis present

## 2019-07-26 DIAGNOSIS — F17210 Nicotine dependence, cigarettes, uncomplicated: Secondary | ICD-10-CM | POA: Diagnosis present

## 2019-07-26 DIAGNOSIS — Z720 Tobacco use: Secondary | ICD-10-CM | POA: Diagnosis not present

## 2019-07-26 DIAGNOSIS — Z20822 Contact with and (suspected) exposure to covid-19: Secondary | ICD-10-CM | POA: Diagnosis present

## 2019-07-26 DIAGNOSIS — Z79899 Other long term (current) drug therapy: Secondary | ICD-10-CM | POA: Diagnosis not present

## 2019-07-26 DIAGNOSIS — Z880 Allergy status to penicillin: Secondary | ICD-10-CM

## 2019-07-26 DIAGNOSIS — I160 Hypertensive urgency: Secondary | ICD-10-CM | POA: Diagnosis present

## 2019-07-26 DIAGNOSIS — A419 Sepsis, unspecified organism: Secondary | ICD-10-CM

## 2019-07-26 DIAGNOSIS — F4310 Post-traumatic stress disorder, unspecified: Secondary | ICD-10-CM | POA: Diagnosis present

## 2019-07-26 DIAGNOSIS — L03211 Cellulitis of face: Secondary | ICD-10-CM | POA: Diagnosis present

## 2019-07-26 DIAGNOSIS — B9562 Methicillin resistant Staphylococcus aureus infection as the cause of diseases classified elsewhere: Secondary | ICD-10-CM | POA: Diagnosis present

## 2019-07-26 DIAGNOSIS — S0031XA Abrasion of nose, initial encounter: Secondary | ICD-10-CM

## 2019-07-26 DIAGNOSIS — Z7982 Long term (current) use of aspirin: Secondary | ICD-10-CM | POA: Diagnosis not present

## 2019-07-26 DIAGNOSIS — A4902 Methicillin resistant Staphylococcus aureus infection, unspecified site: Secondary | ICD-10-CM

## 2019-07-26 DIAGNOSIS — I10 Essential (primary) hypertension: Secondary | ICD-10-CM | POA: Diagnosis present

## 2019-07-26 LAB — BASIC METABOLIC PANEL
Anion gap: 12 (ref 5–15)
BUN: 8 mg/dL (ref 6–20)
CO2: 25 mmol/L (ref 22–32)
Calcium: 9.1 mg/dL (ref 8.9–10.3)
Chloride: 100 mmol/L (ref 98–111)
Creatinine, Ser: 0.95 mg/dL (ref 0.61–1.24)
GFR calc Af Amer: >60 mL/min (ref >60)
GFR calc non Af Amer: >60 mL/min (ref >60)
Glucose, Bld: 92 mg/dL (ref 70–99)
Potassium: 4 mmol/L (ref 3.5–5.1)
Sodium: 137 mmol/L (ref 135–145)

## 2019-07-26 LAB — CBC WITH DIFFERENTIAL/PLATELET
Abs Immature Granulocytes: 0.07 10*3/uL (ref 0.00–0.07)
Basophils Absolute: 0.1 10*3/uL (ref 0.0–0.1)
Basophils Relative: 0 %
Eosinophils Absolute: 0.3 10*3/uL (ref 0.0–0.5)
Eosinophils Relative: 2 %
HCT: 38.6 % — ABNORMAL LOW (ref 39.0–52.0)
Hemoglobin: 13 g/dL (ref 13.0–17.0)
Immature Granulocytes: 1 %
Lymphocytes Relative: 16 %
Lymphs Abs: 2.5 10*3/uL (ref 0.7–4.0)
MCH: 32.6 pg (ref 26.0–34.0)
MCHC: 33.7 g/dL (ref 30.0–36.0)
MCV: 96.7 fL (ref 80.0–100.0)
Monocytes Absolute: 2.1 10*3/uL — ABNORMAL HIGH (ref 0.1–1.0)
Monocytes Relative: 14 %
Neutro Abs: 10.4 10*3/uL — ABNORMAL HIGH (ref 1.7–7.7)
Neutrophils Relative %: 67 %
Platelets: 367 10*3/uL (ref 150–400)
RBC: 3.99 MIL/uL — ABNORMAL LOW (ref 4.22–5.81)
RDW: 14 % (ref 11.5–15.5)
WBC: 15.4 10*3/uL — ABNORMAL HIGH (ref 4.0–10.5)
nRBC: 0 % (ref 0.0–0.2)

## 2019-07-26 LAB — SARS CORONAVIRUS 2 BY RT PCR (HOSPITAL ORDER, PERFORMED IN ~~LOC~~ HOSPITAL LAB): SARS Coronavirus 2: NEGATIVE

## 2019-07-26 LAB — MRSA PCR SCREENING: MRSA by PCR: POSITIVE — AB

## 2019-07-26 LAB — LACTIC ACID, PLASMA: Lactic Acid, Venous: 0.8 mmol/L (ref 0.5–1.9)

## 2019-07-26 MED ORDER — FOLIC ACID 1 MG PO TABS
1.0000 mg | ORAL_TABLET | Freq: Every day | ORAL | Status: DC
  Administered 2019-07-26 – 2019-07-29 (×4): 1 mg via ORAL
  Filled 2019-07-26 (×4): qty 1

## 2019-07-26 MED ORDER — DEXAMETHASONE SODIUM PHOSPHATE 10 MG/ML IJ SOLN
10.0000 mg | Freq: Once | INTRAMUSCULAR | Status: AC
  Administered 2019-07-26: 10 mg via INTRAVENOUS
  Filled 2019-07-26: qty 1

## 2019-07-26 MED ORDER — VANCOMYCIN HCL 1500 MG/300ML IV SOLN
1500.0000 mg | Freq: Two times a day (BID) | INTRAVENOUS | Status: DC
  Administered 2019-07-26 – 2019-07-29 (×6): 1500 mg via INTRAVENOUS
  Filled 2019-07-26 (×6): qty 300

## 2019-07-26 MED ORDER — ONDANSETRON HCL 4 MG/2ML IJ SOLN
4.0000 mg | Freq: Once | INTRAMUSCULAR | Status: AC
  Administered 2019-07-26: 4 mg via INTRAVENOUS
  Filled 2019-07-26: qty 2

## 2019-07-26 MED ORDER — ADULT MULTIVITAMIN W/MINERALS CH
1.0000 | ORAL_TABLET | Freq: Every day | ORAL | Status: DC
  Administered 2019-07-26 – 2019-07-29 (×4): 1 via ORAL
  Filled 2019-07-26 (×4): qty 1

## 2019-07-26 MED ORDER — VENLAFAXINE HCL ER 75 MG PO CP24
225.0000 mg | ORAL_CAPSULE | Freq: Every day | ORAL | Status: DC
  Administered 2019-07-26 – 2019-07-29 (×4): 225 mg via ORAL
  Filled 2019-07-26 (×4): qty 3

## 2019-07-26 MED ORDER — MIRTAZAPINE 15 MG PO TABS
15.0000 mg | ORAL_TABLET | Freq: Every day | ORAL | Status: DC
  Administered 2019-07-26 – 2019-07-28 (×3): 15 mg via ORAL
  Filled 2019-07-26 (×3): qty 1

## 2019-07-26 MED ORDER — IOHEXOL 300 MG/ML  SOLN
75.0000 mL | Freq: Once | INTRAMUSCULAR | Status: AC | PRN
  Administered 2019-07-26: 75 mL via INTRAVENOUS

## 2019-07-26 MED ORDER — OXYCODONE HCL 5 MG PO TABS
5.0000 mg | ORAL_TABLET | ORAL | Status: DC | PRN
  Administered 2019-07-26 – 2019-07-29 (×14): 5 mg via ORAL
  Filled 2019-07-26 (×14): qty 1

## 2019-07-26 MED ORDER — HYDROMORPHONE HCL 1 MG/ML IJ SOLN
1.0000 mg | INTRAMUSCULAR | Status: DC | PRN
  Administered 2019-07-26 – 2019-07-29 (×14): 1 mg via INTRAVENOUS
  Filled 2019-07-26 (×14): qty 1

## 2019-07-26 MED ORDER — LORAZEPAM 2 MG/ML IJ SOLN
0.5000 mg | Freq: Once | INTRAMUSCULAR | Status: AC
  Administered 2019-07-26: 0.5 mg via INTRAVENOUS
  Filled 2019-07-26: qty 1

## 2019-07-26 MED ORDER — HYDROXYZINE HCL 25 MG PO TABS
25.0000 mg | ORAL_TABLET | Freq: Four times a day (QID) | ORAL | Status: DC | PRN
  Administered 2019-07-26 – 2019-07-29 (×8): 25 mg via ORAL
  Filled 2019-07-26 (×9): qty 1

## 2019-07-26 MED ORDER — SODIUM CHLORIDE 0.9 % IV SOLN
2.0000 g | Freq: Three times a day (TID) | INTRAVENOUS | Status: DC
  Administered 2019-07-26 – 2019-07-29 (×11): 2 g via INTRAVENOUS
  Filled 2019-07-26 (×11): qty 2

## 2019-07-26 MED ORDER — KETOROLAC TROMETHAMINE 15 MG/ML IJ SOLN
15.0000 mg | Freq: Once | INTRAMUSCULAR | Status: AC
  Administered 2019-07-26: 15 mg via INTRAVENOUS
  Filled 2019-07-26: qty 1

## 2019-07-26 MED ORDER — VANCOMYCIN HCL 2000 MG/400ML IV SOLN
2000.0000 mg | Freq: Once | INTRAVENOUS | Status: AC
  Administered 2019-07-26: 2000 mg via INTRAVENOUS
  Filled 2019-07-26: qty 400

## 2019-07-26 MED ORDER — SODIUM CHLORIDE 0.9 % IV BOLUS
1000.0000 mL | Freq: Once | INTRAVENOUS | Status: AC
  Administered 2019-07-26: 1000 mL via INTRAVENOUS

## 2019-07-26 MED ORDER — VENLAFAXINE HCL ER 75 MG PO CP24
75.0000 mg | ORAL_CAPSULE | Freq: Every day | ORAL | Status: DC
  Administered 2019-07-26 – 2019-07-28 (×3): 75 mg via ORAL
  Filled 2019-07-26 (×3): qty 1

## 2019-07-26 MED ORDER — HYDROMORPHONE HCL 1 MG/ML IJ SOLN
1.0000 mg | Freq: Once | INTRAMUSCULAR | Status: AC
  Administered 2019-07-26: 1 mg via INTRAVENOUS
  Filled 2019-07-26: qty 1

## 2019-07-26 MED ORDER — THIAMINE HCL 100 MG PO TABS
100.0000 mg | ORAL_TABLET | Freq: Every day | ORAL | Status: DC
  Administered 2019-07-26 – 2019-07-29 (×4): 100 mg via ORAL
  Filled 2019-07-26 (×4): qty 1

## 2019-07-26 MED ORDER — CHLORHEXIDINE GLUCONATE CLOTH 2 % EX PADS: 6.0000 | MEDICATED_PAD | Freq: Every day | CUTANEOUS | Status: DC

## 2019-07-26 MED ORDER — ONDANSETRON HCL 4 MG/2ML IJ SOLN
4.0000 mg | Freq: Four times a day (QID) | INTRAMUSCULAR | Status: DC | PRN
  Administered 2019-07-26 – 2019-07-27 (×2): 4 mg via INTRAVENOUS
  Filled 2019-07-26 (×2): qty 2

## 2019-07-26 MED ORDER — METOCLOPRAMIDE HCL 5 MG/ML IJ SOLN
10.0000 mg | Freq: Once | INTRAMUSCULAR | Status: AC
  Administered 2019-07-26: 10 mg via INTRAVENOUS
  Filled 2019-07-26: qty 2

## 2019-07-26 MED ORDER — SENNA 8.6 MG PO TABS
2.0000 | ORAL_TABLET | Freq: Every day | ORAL | Status: DC
  Administered 2019-07-26 – 2019-07-28 (×3): 17.2 mg via ORAL
  Filled 2019-07-26 (×3): qty 2

## 2019-07-26 MED ORDER — ENOXAPARIN SODIUM 40 MG/0.4ML ~~LOC~~ SOLN
40.0000 mg | SUBCUTANEOUS | Status: DC
  Administered 2019-07-26: 40 mg via SUBCUTANEOUS
  Filled 2019-07-26 (×2): qty 0.4

## 2019-07-26 MED ORDER — PROMETHAZINE HCL 25 MG/ML IJ SOLN
6.2500 mg | Freq: Three times a day (TID) | INTRAMUSCULAR | Status: DC | PRN
  Administered 2019-07-28: 6.25 mg via INTRAVENOUS
  Filled 2019-07-26: qty 1

## 2019-07-26 NOTE — ED Notes (Signed)
CRITICAL VALUE ALERT  Critical Value:  MRSA positive  Date & Time Notied:  07/26/2019,   Provider Notified: Dr. Renaye Rakers  Orders Received/Actions taken: see chart

## 2019-07-26 NOTE — Plan of Care (Signed)
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
## 2019-07-26 NOTE — ED Notes (Signed)
Pt refused ekg

## 2019-07-26 NOTE — Progress Notes (Signed)
Pharmacy Antibiotic Note  Andrew Donaldson is a 39 y.o. male admitted on 07/26/2019 with cellulitis.  Pharmacy has been consulted for vancomycin and cefepime  dosing.  Patient reports anaphylaxis with penicillin, so a trial of cefepime is reasonable at this time.  Plan: Start cefepime 2g IV q8h  Give vancomycin 2g IV x1 dose, then vancomycin 1.5g IV q12h Goal vancomycin trough range:15-73mcg/mL Pharmacy will continue to monitor renal function, vancomycin troughs as clinically appropriate, cultures and patient progress.  Height: 5\' 9"  (175.3 cm) Weight: 99.8 kg (220 lb) IBW/kg (Calculated) : 70.7  Temp (24hrs), Avg:97.7 F (36.5 C), Min:97.7 F (36.5 C), Max:97.7 F (36.5 C)  Recent Labs  Lab 07/26/19 0750  WBC 15.4*  CREATININE 0.95  LATICACIDVEN 0.8    Estimated Creatinine Clearance: 121.5 mL/min (by C-G formula based on SCr of 0.95 mg/dL).    Allergies  Allergen Reactions   Penicillins Anaphylaxis    Has patient had a PCN reaction causing immediate rash, facial/tongue/throat swelling, SOB or lightheadedness with hypotension: Yes Has patient had a PCN reaction causing severe rash involving mucus membranes or skin necrosis: Yes Has patient had a PCN reaction that required hospitalization No Has patient had a PCN reaction occurring within the last 10 years: No If all of the above answers are "NO", then may proceed with Cephalosporin use.     Antimicrobials this admission: cefepime 6/20>>  vancomycin  6/20  >>      Microbiology results: 6/20 [facility] x2:   6/20  MRSA PCR:    Thank you for allowing pharmacy to be a part of this patients care.  7/20 07/26/2019 8:48 AM

## 2019-07-26 NOTE — ED Triage Notes (Signed)
Pt reports diagnosed with staph infection on nose and facial cellulitis.  Reports was treated out of town with po antibiotics then at another hospital with IV antibiotics.  Pt has wound to r nare and swelling to nose and face.

## 2019-07-26 NOTE — ED Notes (Signed)
Dd. Renaye Rakers at bedside U/S
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
## 2019-07-27 DIAGNOSIS — Z720 Tobacco use: Secondary | ICD-10-CM

## 2019-07-27 LAB — CBC WITH DIFFERENTIAL/PLATELET
Abs Immature Granulocytes: 0.16 10*3/uL — ABNORMAL HIGH (ref 0.00–0.07)
Basophils Absolute: 0 10*3/uL (ref 0.0–0.1)
Basophils Relative: 0 %
Eosinophils Absolute: 0.2 10*3/uL (ref 0.0–0.5)
Eosinophils Relative: 1 %
HCT: 37.3 % — ABNORMAL LOW (ref 39.0–52.0)
Hemoglobin: 12.3 g/dL — ABNORMAL LOW (ref 13.0–17.0)
Immature Granulocytes: 1 %
Lymphocytes Relative: 5 %
Lymphs Abs: 0.9 10*3/uL (ref 0.7–4.0)
MCH: 32.5 pg (ref 26.0–34.0)
MCHC: 33 g/dL (ref 30.0–36.0)
MCV: 98.4 fL (ref 80.0–100.0)
Monocytes Absolute: 1.1 10*3/uL — ABNORMAL HIGH (ref 0.1–1.0)
Monocytes Relative: 6 %
Neutro Abs: 15.2 10*3/uL — ABNORMAL HIGH (ref 1.7–7.7)
Neutrophils Relative %: 87 %
Platelets: 415 10*3/uL — ABNORMAL HIGH (ref 150–400)
RBC: 3.79 MIL/uL — ABNORMAL LOW (ref 4.22–5.81)
RDW: 13.9 % (ref 11.5–15.5)
WBC: 17.5 10*3/uL — ABNORMAL HIGH (ref 4.0–10.5)
nRBC: 0 % (ref 0.0–0.2)

## 2019-07-27 LAB — COMPREHENSIVE METABOLIC PANEL
ALT: 16 U/L (ref 0–44)
AST: 13 U/L — ABNORMAL LOW (ref 15–41)
Albumin: 3 g/dL — ABNORMAL LOW (ref 3.5–5.0)
Alkaline Phosphatase: 85 U/L (ref 38–126)
Anion gap: 9 (ref 5–15)
BUN: 10 mg/dL (ref 6–20)
CO2: 26 mmol/L (ref 22–32)
Calcium: 9.1 mg/dL (ref 8.9–10.3)
Chloride: 101 mmol/L (ref 98–111)
Creatinine, Ser: 0.8 mg/dL (ref 0.61–1.24)
GFR calc Af Amer: >60 mL/min (ref >60)
GFR calc non Af Amer: >60 mL/min (ref >60)
Glucose, Bld: 199 mg/dL — ABNORMAL HIGH (ref 70–99)
Potassium: 4.4 mmol/L (ref 3.5–5.1)
Sodium: 136 mmol/L (ref 135–145)
Total Bilirubin: 0.5 mg/dL (ref 0.3–1.2)
Total Protein: 6.8 g/dL (ref 6.5–8.1)

## 2019-07-27 LAB — MAGNESIUM: Magnesium: 2.1 mg/dL (ref 1.7–2.4)

## 2019-07-27 LAB — HEMOGLOBIN A1C
Hgb A1c MFr Bld: 5.4 % (ref 4.8–5.6)
Mean Plasma Glucose: 108 mg/dL

## 2019-07-27 LAB — PHOSPHORUS: Phosphorus: 1.8 mg/dL — ABNORMAL LOW (ref 2.5–4.6)

## 2019-07-27 LAB — HIV ANTIBODY (ROUTINE TESTING W REFLEX): HIV Screen 4th Generation wRfx: NONREACTIVE

## 2019-07-27 MED ORDER — BUSPIRONE HCL 5 MG PO TABS
5.0000 mg | ORAL_TABLET | Freq: Three times a day (TID) | ORAL | Status: DC
  Administered 2019-07-27 – 2019-07-29 (×8): 5 mg via ORAL
  Filled 2019-07-27 (×8): qty 1

## 2019-07-27 MED ORDER — NICOTINE 21 MG/24HR TD PT24
21.0000 mg | MEDICATED_PATCH | Freq: Every day | TRANSDERMAL | Status: DC
  Administered 2019-07-27 – 2019-07-29 (×3): 21 mg via TRANSDERMAL
  Filled 2019-07-27 (×3): qty 1

## 2019-07-27 MED ORDER — LORAZEPAM 2 MG/ML IJ SOLN
0.5000 mg | Freq: Four times a day (QID) | INTRAMUSCULAR | Status: DC | PRN
  Administered 2019-07-27 – 2019-07-29 (×8): 0.5 mg via INTRAVENOUS
  Filled 2019-07-27 (×8): qty 1

## 2019-07-27 MED ORDER — CHLORHEXIDINE GLUCONATE CLOTH 2 % EX PADS
6.0000 | MEDICATED_PAD | Freq: Every day | CUTANEOUS | Status: DC
  Administered 2019-07-27 – 2019-07-29 (×2): 6 via TOPICAL

## 2019-07-27 MED ORDER — MUPIROCIN 2 % EX OINT
1.0000 "application " | TOPICAL_OINTMENT | Freq: Two times a day (BID) | CUTANEOUS | Status: DC
  Administered 2019-07-27 – 2019-07-29 (×5): 1 via NASAL
  Filled 2019-07-27: qty 22

## 2019-07-27 NOTE — Progress Notes (Signed)
PROGRESS NOTE  Andrew Donaldson VZC:[id number] DOB: 1980/06/01 DOA: 07/26/2019 PCP: [facility]  Brief History:  39 year old male with a history of PTSD, tobacco abuse, anxiety, alcohol dependence presenting with facial pain and nasal swelling for 3 to 4 days prior to admission.  The patient is a Administrator.  He stopped to outside hospitals' emergency departments prior to arrival to [facility].  Most recently, the patient was given bleomycin and Percocet.  He stated that the swelling and pain continue to worsen.  He denied any fevers, chills, nausea, vomiting, diarrhea, abdominal pain, chest pain, shortness of breath. In the emergency department, the patient was afebrile hemodynamically stable with oxygen saturation 94-97% on room air.  WBC initially was 15.4 with hemoglobin 13.0 and platelets 267,000.  CT of the face showed extensive inflammatory changes within the soft tissue component of the nose anteriorly.  There was heterogenous enhancement without discrete abscess.  There is no invasive component evident but inflammatory changes extended to the right periorbital soft tissues.  There is no post septal inflammation.  The patient was started on vancomycin and cefepime.  Assessment/Plan: Facial cellulitis -Continue vancomycin and cefepime -See pictures below -CT maxillofacial--as discussed above, no abscess -WBC up today due to dexamethasone 10 mg IV given on 07/26/19 -Dr. Nevada Crane spoke with Dr. Irineo Axon nonoperative management  PTSD/chronic anxiety Resume home regimen  Elevated blood pressure suspected related to pain No prior history of hypertension Pain control IV antihypertensive as needed with parameters Continue to monitor vital signs  Tobacco use disorder Smokes half a pack a day Tobacco cessation counseling at bedside Nicotine patch  Alcohol use, occasional No evidence of alcohol withdrawal at the time of this visit Start  multivitamin, thiamine and folate    Status is: Inpatient  Remains inpatient appropriate because:IV treatments appropriate due to intensity of illness or inability to take PO   Dispo: The patient is from: Home              Anticipated d/c is to: Home              Anticipated d/c date is: 2 days              Patient currently is not medically stable to d/c.        Family Communication:   Family at bedside  Consultants:  ENT by phone  Code Status:  FULL   DVT Prophylaxis:  [place] Lovenox   Procedures: As Listed in Progress Note Above  Antibiotics: vanco 6/20>>> Cefepime 6/20>>       Subjective: Patient continues to complain of pain in the face.  He states that it is a little bit better.  He denies any headache, fevers, chills, chest pain, shortness breath, nausea, vomiting, diarrhea, abdominal pain.  Objective: Vitals:   07/27/19 0215 07/27/19 0400 07/27/19 0500 07/27/19 0600  BP: (!) 141/96     Pulse: (!) 109  (!) 101 90  Resp: 17  20 13   Temp:  98.2 F (36.8 C)    TempSrc:  Oral    SpO2: 94%  97% 94%  Weight:      Height:        Intake/Output Summary (Last 24 hours) at 07/27/2019 0830 Last data filed at 07/27/2019 0500 Gross per 24 hour  Intake 2000 ml  Output --  Net 2000 ml   Weight change:  Exam:   General:  Pt is alert, follows commands appropriately,  not in acute distress  HEENT: No icterus, No thrush, see pictures below  Cardiovascular: RRR, S1/S2, no rubs, no gallops  Respiratory: Diminished breath sounds bilateral.  Fine bibasilar crackles but no wheezing.  Abdomen: Soft/+BS, non tender, non distended, no guarding  Extremities: No edema, No lymphangitis, No petechiae, No rashes, no synovitis         Data Reviewed: I have personally reviewed following labs and imaging studies Basic Metabolic Panel: Recent Labs  Lab 07/26/19 0750 07/27/19 0405  NA 137 136  K 4.0 4.4  CL 100 101  CO2 25 26  GLUCOSE 92 199*  BUN 8 10   CREATININE 0.95 0.80  CALCIUM 9.1 9.1  MG  --  2.1  PHOS  --  1.8*   Liver Function Tests: Recent Labs  Lab 07/27/19 0405  AST 13*  ALT 16  ALKPHOS 85  BILITOT 0.5  PROT 6.8  ALBUMIN 3.0*   No results for input(s): LIPASE, AMYLASE in the last 168 hours. No results for input(s): AMMONIA in the last 168 hours. Coagulation Profile: No results for input(s): INR, PROTIME in the last 168 hours. CBC: Recent Labs  Lab 07/26/19 0750 07/27/19 0405  WBC 15.4* 17.5*  NEUTROABS 10.4* 15.2*  HGB 13.0 12.3*  HCT 38.6* 37.3*  MCV 96.7 98.4  PLT 367 415*   Cardiac Enzymes: No results for input(s): CKTOTAL, CKMB, CKMBINDEX, TROPONINI in the last 168 hours. BNP: Invalid input(s): POCBNP CBG: No results for input(s): GLUCAP in the last 168 hours. HbA1C: No results for input(s): HGBA1C in the last 72 hours. Urine analysis:    Component Value Date/Time   COLORURINE YELLOW 07/16/2017 1704   APPEARANCEUR HAZY (A) 07/16/2017 1704   LABSPEC >1.046 (H) 07/16/2017 1704   PHURINE 7.0 07/16/2017 1704   GLUCOSEU NEGATIVE 07/16/2017 1704   HGBUR NEGATIVE 07/16/2017 1704   BILIRUBINUR NEGATIVE 07/16/2017 1704   KETONESUR NEGATIVE 07/16/2017 1704   PROTEINUR NEGATIVE 07/16/2017 1704   UROBILINOGEN 0.2 04/21/2014 0021   NITRITE NEGATIVE 07/16/2017 1704   LEUKOCYTESUR NEGATIVE 07/16/2017 1704   Sepsis Labs: @LABRCNTIP (procalcitonin:4,lacticidven:4) ) Recent Results (from the past 240 hour(s))  MRSA PCR Screening     Status: Abnormal   Collection Time: 07/26/19  7:20 AM   Specimen: Nasopharyngeal  Result Value Ref Range Status   MRSA by PCR POSITIVE (A) NEGATIVE Final    Comment:        The GeneXpert MRSA Assay (FDA approved for NASAL specimens only), is one component of a comprehensive MRSA colonization surveillance program. It is not intended to diagnose MRSA infection nor to guide or monitor treatment for MRSA infections. RESULT CALLED TO, READ BACK BY AND VERIFIED  WITH: CARDWELL L. @ 1047 ON 07/28/19 BY HENDERSON L. Performed at [facility], [address] [place], [place]   Blood culture (routine x 2)     Status: None (Preliminary result)   Collection Time: 07/26/19  7:50 AM   Specimen: Left Antecubital; Blood  Result Value Ref Range Status   Specimen Description   Final    LEFT ANTECUBITAL BOTTLES DRAWN AEROBIC AND ANAEROBIC   Special Requests Blood Culture adequate volume  Final   Culture   Final    NO GROWTH 1 DAY Performed at [facility], [address] [place], [place]    Report Status PENDING  Incomplete  Blood culture (routine x 2)     Status: None (Preliminary result)   Collection Time: 07/26/19  7:51 AM   Specimen:  Right Antecubital; Blood  Result Value Ref Range Status   Specimen Description   Final    RIGHT ANTECUBITAL BOTTLES DRAWN AEROBIC AND ANAEROBIC   Special Requests Blood Culture adequate volume  Final   Culture   Final    NO GROWTH 1 DAY Performed at [facility], [address], [place], [place] [address]    Report Status PENDING  Incomplete  SARS Coronavirus 2 by RT PCR (hospital order, performed in [facility] hospital lab) Nasopharyngeal Nasopharyngeal Swab     Status: None   Collection Time: 07/26/19 11:04 AM   Specimen: Nasopharyngeal Swab  Result Value Ref Range Status   SARS Coronavirus 2 NEGATIVE NEGATIVE Final    Comment: (NOTE) SARS-CoV-2 target nucleic acids are NOT DETECTED.  The SARS-CoV-2 RNA is generally detectable in upper and lower respiratory specimens during the acute phase of infection. The lowest concentration of SARS-CoV-2 viral copies this assay can detect is 250 copies / mL. A negative result does not preclude SARS-CoV-2 infection and should not be used as the sole basis for treatment or other patient management decisions.  A negative result may occur with improper specimen collection / handling, submission of specimen other than nasopharyngeal swab, presence of viral  mutation(s) within the areas targeted by this assay, and inadequate number of viral copies (<250 copies / mL). A negative result must be combined with clinical observations, patient history, and epidemiological information.  Fact Sheet for Patients:   BoilerBrush.com.cy  Fact Sheet for Healthcare Providers: https://pope.com/  This test is not yet approved or  cleared by the [place] FDA and has been authorized for detection and/or diagnosis of SARS-CoV-2 by FDA under an Emergency Use Authorization (EUA).  This EUA will remain in effect (meaning this test can be used) for the duration of the COVID-19 declaration under Section 564(b)(1) of the Act, 21 U.S.C. section 360bbb-3(b)(1), unless the authorization is terminated or revoked sooner.  Performed at [facility], [address] [place], [place] [address]      Scheduled Meds: . Chlorhexidine Gluconate Cloth  6 each Topical Daily  . Chlorhexidine Gluconate Cloth  6 each Topical Q0600  . enoxaparin (LOVENOX) injection  40 mg Subcutaneous Q24H  . folic acid  1 mg Oral Daily  . mirtazapine  15 mg Oral QHS  . multivitamin with minerals  1 tablet Oral Daily  . mupirocin ointment  1 application Nasal BID  . senna  2 tablet Oral QHS  . thiamine  100 mg Oral Daily  . venlafaxine XR  225 mg Oral Daily  . venlafaxine XR  75 mg Oral QHS   Continuous Infusions: . ceFEPime (MAXIPIME) IV 2 g (07/27/19 0754)  . vancomycin Stopped (07/26/19 2310)    Procedures/Studies: CT Maxillofacial W Contrast  Result Date: 07/26/2019 CLINICAL DATA:  Right-sided facial pain.  Infection of the nose. EXAM: CT MAXILLOFACIAL WITH CONTRAST TECHNIQUE: Multidetector CT imaging of the maxillofacial structures was performed with intravenous contrast. Multiplanar CT image reconstructions were also generated. CONTRAST:  73mL OMNIPAQUE IOHEXOL 300 MG/ML  SOLN COMPARISON:  CT of the face 10/10/2017 FINDINGS:  Osseous: No acute or focal osseous abnormalities are present. No erosions are evident. Orbits: Right periorbital soft tissue swelling is present. No postseptal inflammatory changes are present. Globes and orbits are otherwise within normal limits. Sinuses: Mucosal thickening is present along the floor of the left maxillary sinus. Remaining paranasal sinuses and mastoid air cells are clear. Soft tissues: Extensive inflammatory changes are present within the soft tissue  component is the nose anteriorly. Heterogeneous enhancement pattern is present without a discrete abscess. Inflammatory changes extend into the right periorbital soft tissues, more prominent inferiorly. Stranding extends superiorly to the forehead as well. Asymmetric superficial and right submandibular lymph nodes are likely reactive. Limited intracranial: Within normal limits. IMPRESSION: 1. Extensive inflammatory changes within the soft tissue component is the nose anteriorly. 2. Heterogeneous enhancement pattern without a discrete abscess. The pattern likely reflects a cellulitis. No invasive components are evident. 3. Inflammatory changes extend into the right periorbital soft tissues, more prominent inferiorly. 4. No postseptal inflammatory changes. 5. Asymmetric superficial and right submandibular lymph nodes are likely reactive. 6. Mucosal thickening along the floor of the left maxillary sinus. Electronically Signed   By: Marin Roberts M.D.   On: 07/26/2019 09:08    Catarina Hartshorn, DO  [facility]  If 7PM-7AM, please contact night-coverage [email or web address] Password [email or web address] 07/27/2019, 8:30 AM   LOS: 1 day

## 2019-07-27 NOTE — TOC Progression Note (Addendum)
[facility]. Notification ID is [id number].

## 2019-07-28 DIAGNOSIS — A419 Sepsis, unspecified organism: Secondary | ICD-10-CM

## 2019-07-28 LAB — BASIC METABOLIC PANEL
Anion gap: 11 (ref 5–15)
BUN: 10 mg/dL (ref 6–20)
CO2: 26 mmol/L (ref 22–32)
Calcium: 8.7 mg/dL — ABNORMAL LOW (ref 8.9–10.3)
Chloride: 101 mmol/L (ref 98–111)
Creatinine, Ser: 0.8 mg/dL (ref 0.61–1.24)
GFR calc Af Amer: >60 mL/min (ref >60)
GFR calc non Af Amer: >60 mL/min (ref >60)
Glucose, Bld: 217 mg/dL — ABNORMAL HIGH (ref 70–99)
Potassium: 3.8 mmol/L (ref 3.5–5.1)
Sodium: 138 mmol/L (ref 135–145)

## 2019-07-28 LAB — CBC
HCT: 38.7 % — ABNORMAL LOW (ref 39.0–52.0)
Hemoglobin: 12.7 g/dL — ABNORMAL LOW (ref 13.0–17.0)
MCH: 32.3 pg (ref 26.0–34.0)
MCHC: 32.8 g/dL (ref 30.0–36.0)
MCV: 98.5 fL (ref 80.0–100.0)
Platelets: 444 10*3/uL — ABNORMAL HIGH (ref 150–400)
RBC: 3.93 MIL/uL — ABNORMAL LOW (ref 4.22–5.81)
RDW: 14 % (ref 11.5–15.5)
WBC: 15.3 10*3/uL — ABNORMAL HIGH (ref 4.0–10.5)
nRBC: 0 % (ref 0.0–0.2)

## 2019-07-28 LAB — GLUCOSE, CAPILLARY: Glucose-Capillary: 109 mg/dL — ABNORMAL HIGH (ref 70–99)

## 2019-07-28 NOTE — Progress Notes (Signed)
PROGRESS NOTE  Andrew Donaldson ZDG:[id number] DOB: 1980/09/28 DOA: 07/26/2019 PCP: [facility]   Brief History:  39 year old male with a history of PTSD, tobacco abuse, anxiety, alcohol dependence presenting with facial pain and nasal swelling for 3 to 4 days prior to admission.  The patient is a [employer].  He stopped to outside hospitals' emergency departments prior to arrival to [facility].  Most recently, the patient was given bleomycin and Percocet.  He stated that the swelling and pain continue to worsen.  He denied any fevers, chills, nausea, vomiting, diarrhea, abdominal pain, chest pain, shortness of breath. In the emergency department, the patient was afebrile hemodynamically stable with oxygen saturation 94-97% on room air.  WBC initially was 15.4 with hemoglobin 13.0 and platelets 267,000.  CT of the face showed extensive inflammatory changes within the soft tissue component of the nose anteriorly.  There was heterogenous enhancement without discrete abscess.  There is no invasive component evident but inflammatory changes extended to the right periorbital soft tissues.  There is no post septal inflammation.  The patient was started on vancomycin and cefepime.  Assessment/Plan: Facial cellulitis -Continue vancomycin and cefepime -See pictures below from each day  -CT maxillofacial--as discussed above, no abscess -WBC up today due to dexamethasone 10 mg IV given on 07/26/19 -Dr. Margo Aye spoke with Dr. Sol Passer nonoperative management  PTSD/chronic anxiety Resume home regimen  Elevated blood pressure suspected related to pain No prior history of hypertension Pain control IV antihypertensive as needed with parameters Continue to monitor vital signs  Tobacco use disorder Smokes halfapack a day Tobacco cessation counseling at bedside Nicotine patch  Alcohol use,occasional No evidence of alcohol withdrawal at the time of this  visit Start multivitamin, thiamine and folate    Status is: Inpatient  Remains inpatient appropriate because:IV treatments appropriate due to intensity of illness or inability to take PO   Dispo: The patient is from: Home  Anticipated d/c is to: Home  Anticipated d/c date is: 6//23 if continues to improve  Patient currently is not medically stable to d/c.        Family Communication:   Fiance at bedside 6/22  Consultants:  ENT by phone  Code Status:  FULL   DVT Prophylaxis:  Junior Lovenox   Procedures: As Listed in Progress Note Above  Antibiotics: vanco 6/20>>> Cefepime 6/20>>      Subjective: Pt states nose/face still hurts but it is improving.  Patient denies fevers, chills, headache, chest pain, dyspnea, nausea, vomiting, diarrhea, abdominal pain, dysuria, hematuria, hematochezia, and melena.   Objective: Vitals:   07/28/19 0245 07/28/19 0400 07/28/19 0500 07/28/19 1643  BP:    (!) 139/95  Pulse:    (!) 108  Resp: 15   16  Temp:  97.8 F (36.6 C)  98.4 F (36.9 C)  TempSrc:  Oral  Oral  SpO2:    97%  Weight:   100.2 kg   Height:        Intake/Output Summary (Last 24 hours) at 07/28/2019 1809 Last data filed at 07/28/2019 0400 Gross per 24 hour  Intake 1219.66 ml  Output --  Net 1219.66 ml   Weight change: 0.409 kg Exam:   General:  Pt is alert, follows commands appropriately, not in acute distress  HEENT: see pictures  Cardiovascular: RRR, S1/S2, no rubs, no gallops  Respiratory: diminished BS.  Bibasilar crackles.  Abdomen: Soft/+BS, non tender, non distended, no guarding  Extremities: No edema, No lymphangitis, No petechiae, No rashes, no synovitis          Data Reviewed: I have personally reviewed following labs and imaging studies Basic Metabolic Panel: Recent Labs  Lab 07/26/19 0750 07/27/19 0405 07/28/19 0409  NA 137 136 138  K 4.0 4.4 3.8  CL 100 101 101   CO2 25 26 26   GLUCOSE 92 199* 217*  BUN 8 10 10   CREATININE 0.95 0.80 0.80  CALCIUM 9.1 9.1 8.7*  MG  --  2.1  --   PHOS  --  1.8*  --    Liver Function Tests: Recent Labs  Lab 07/27/19 0405  AST 13*  ALT 16  ALKPHOS 85  BILITOT 0.5  PROT 6.8  ALBUMIN 3.0*   No results for input(s): LIPASE, AMYLASE in the last 168 hours. No results for input(s): AMMONIA in the last 168 hours. Coagulation Profile: No results for input(s): INR, PROTIME in the last 168 hours. CBC: Recent Labs  Lab 07/26/19 0750 07/27/19 0405 07/28/19 0409  WBC 15.4* 17.5* 15.3*  NEUTROABS 10.4* 15.2*  --   HGB 13.0 12.3* 12.7*  HCT 38.6* 37.3* 38.7*  MCV 96.7 98.4 98.5  PLT 367 415* 444*   Cardiac Enzymes: No results for input(s): CKTOTAL, CKMB, CKMBINDEX, TROPONINI in the last 168 hours. BNP: Invalid input(s): POCBNP CBG: No results for input(s): GLUCAP in the last 168 hours. HbA1C: Recent Labs    07/26/19 0750  HGBA1C 5.4   Urine analysis:    Component Value Date/Time   COLORURINE YELLOW 07/16/2017 1704   APPEARANCEUR HAZY (A) 07/16/2017 1704   LABSPEC >1.046 (H) 07/16/2017 1704   PHURINE 7.0 07/16/2017 1704   GLUCOSEU NEGATIVE 07/16/2017 1704   HGBUR NEGATIVE 07/16/2017 1704   BILIRUBINUR NEGATIVE 07/16/2017 1704   KETONESUR NEGATIVE 07/16/2017 1704   PROTEINUR NEGATIVE 07/16/2017 1704   UROBILINOGEN 0.2 04/21/2014 0021   NITRITE NEGATIVE 07/16/2017 1704   LEUKOCYTESUR NEGATIVE 07/16/2017 1704   Sepsis Labs: @LABRCNTIP (procalcitonin:4,lacticidven:4) ) Recent Results (from the past 240 hour(s))  MRSA PCR Screening     Status: Abnormal   Collection Time: 07/26/19  7:20 AM   Specimen: Nasopharyngeal  Result Value Ref Range Status   MRSA by PCR POSITIVE (A) NEGATIVE Final    Comment:        The GeneXpert MRSA Assay (FDA approved for NASAL specimens only), is one component of a comprehensive MRSA colonization surveillance program. It is not intended to diagnose  MRSA infection nor to guide or monitor treatment for MRSA infections. RESULT CALLED TO, READ BACK BY AND VERIFIED WITH: CARDWELL L. @ 1047 ON 09/15/2017 BY HENDERSON L. Performed at [facility], [address], [place], [facility] [address]   Blood culture (routine x 2)     Status: None (Preliminary result)   Collection Time: 07/26/19  7:50 AM   Specimen: Left Antecubital; Blood  Result Value Ref Range Status   Specimen Description   Final    LEFT ANTECUBITAL BOTTLES DRAWN AEROBIC AND ANAEROBIC   Special Requests Blood Culture adequate volume  Final   Culture   Final    NO GROWTH 2 DAYS Performed at [facility], [address], [place], [facility] [address]    Report Status PENDING  Incomplete  Blood culture (routine x 2)     Status: None (Preliminary result)   Collection Time: 07/26/19  7:51 AM   Specimen: Right Antecubital; Blood  Result Value Ref Range Status   Specimen Description  Final    RIGHT ANTECUBITAL BOTTLES DRAWN AEROBIC AND ANAEROBIC   Special Requests Blood Culture adequate volume  Final   Culture   Final    NO GROWTH 2 DAYS Performed at [facility], [address] [place], [place] [address]    Report Status PENDING  Incomplete  SARS Coronavirus 2 by RT PCR (hospital order, performed in [facility] hospital lab) Nasopharyngeal Nasopharyngeal Swab     Status: None   Collection Time: 07/26/19 11:04 AM   Specimen: Nasopharyngeal Swab  Result Value Ref Range Status   SARS Coronavirus 2 NEGATIVE NEGATIVE Final    Comment: (NOTE) SARS-CoV-2 target nucleic acids are NOT DETECTED.  The SARS-CoV-2 RNA is generally detectable in upper and lower respiratory specimens during the acute phase of infection. The lowest concentration of SARS-CoV-2 viral copies this assay can detect is 250 copies / mL. A negative result does not preclude SARS-CoV-2 infection and should not be used as the sole basis for treatment or other patient management decisions.  A negative result may occur  with improper specimen collection / handling, submission of specimen other than nasopharyngeal swab, presence of viral mutation(s) within the areas targeted by this assay, and inadequate number of viral copies (<250 copies / mL). A negative result must be combined with clinical observations, patient history, and epidemiological information.  Fact Sheet for Patients:   StrictlyIdeas.no  Fact Sheet for Healthcare Providers: BankingDealers.co.za  This test is not yet approved or  cleared by the [place] FDA and has been authorized for detection and/or diagnosis of SARS-CoV-2 by FDA under an Emergency Use Authorization (EUA).  This EUA will remain in effect (meaning this test can be used) for the duration of the COVID-19 declaration under Section 564(b)(1) of the Act, 21 U.S.C. section 360bbb-3(b)(1), unless the authorization is terminated or revoked sooner.  Performed at [facility], [address], [place], [place] [address]      Scheduled Meds: . busPIRone  5 mg Oral TID  . Chlorhexidine Gluconate Cloth  6 each Topical Q0600  . enoxaparin (LOVENOX) injection  40 mg Subcutaneous Q24H  . folic acid  1 mg Oral Daily  . mirtazapine  15 mg Oral QHS  . multivitamin with minerals  1 tablet Oral Daily  . mupirocin ointment  1 application Nasal BID  . nicotine  21 mg Transdermal Daily  . senna  2 tablet Oral QHS  . thiamine  100 mg Oral Daily  . venlafaxine XR  225 mg Oral Daily  . venlafaxine XR  75 mg Oral QHS   Continuous Infusions: . ceFEPime (MAXIPIME) IV 2 g (07/28/19 1522)  . vancomycin 1,500 mg (07/28/19 1540)    Procedures/Studies: CT Maxillofacial W Contrast  Result Date: 07/26/2019 CLINICAL DATA:  Right-sided facial pain.  Infection of the nose. EXAM: CT MAXILLOFACIAL WITH CONTRAST TECHNIQUE: Multidetector CT imaging of the maxillofacial structures was performed with intravenous contrast. Multiplanar CT image  reconstructions were also generated. CONTRAST:  73mL OMNIPAQUE IOHEXOL 300 MG/ML  SOLN COMPARISON:  CT of the face 10/10/2017 FINDINGS: Osseous: No acute or focal osseous abnormalities are present. No erosions are evident. Orbits: Right periorbital soft tissue swelling is present. No postseptal inflammatory changes are present. Globes and orbits are otherwise within normal limits. Sinuses: Mucosal thickening is present along the floor of the left maxillary sinus. Remaining paranasal sinuses and mastoid air cells are clear. Soft tissues: Extensive inflammatory changes are present within the soft tissue component is the nose anteriorly. Heterogeneous enhancement pattern  is present without a discrete abscess. Inflammatory changes extend into the right periorbital soft tissues, more prominent inferiorly. Stranding extends superiorly to the forehead as well. Asymmetric superficial and right submandibular lymph nodes are likely reactive. Limited intracranial: Within normal limits. IMPRESSION: 1. Extensive inflammatory changes within the soft tissue component is the nose anteriorly. 2. Heterogeneous enhancement pattern without a discrete abscess. The pattern likely reflects a cellulitis. No invasive components are evident. 3. Inflammatory changes extend into the right periorbital soft tissues, more prominent inferiorly. 4. No postseptal inflammatory changes. 5. Asymmetric superficial and right submandibular lymph nodes are likely reactive. 6. Mucosal thickening along the floor of the left maxillary sinus. Electronically Signed   By: Marin Roberts M.D.   On: 07/26/2019 09:08    Catarina Hartshorn, DO  [facility]  If 7PM-7AM, please contact night-coverage [email or web address] Password [email or web address] 07/28/2019, 6:09 PM   LOS: 2 days

## 2019-07-29 DIAGNOSIS — A419 Sepsis, unspecified organism: Secondary | ICD-10-CM

## 2019-07-29 DIAGNOSIS — F4310 Post-traumatic stress disorder, unspecified: Secondary | ICD-10-CM

## 2019-07-29 DIAGNOSIS — J340 Abscess, furuncle and carbuncle of nose: Principal | ICD-10-CM

## 2019-07-29 DIAGNOSIS — A4902 Methicillin resistant Staphylococcus aureus infection, unspecified site: Secondary | ICD-10-CM

## 2019-07-29 MED ORDER — OXYCODONE HCL 5 MG PO TABS: 5.0000 mg | ORAL_TABLET | Freq: Four times a day (QID) | ORAL | 0 refills | Status: AC | PRN

## 2019-07-29 MED ORDER — OXYCODONE HCL 5 MG PO TABS
10.0000 mg | ORAL_TABLET | ORAL | Status: DC | PRN
  Administered 2019-07-29: 10 mg via ORAL
  Filled 2019-07-29: qty 2

## 2019-07-29 MED ORDER — SENNA 8.6 MG PO TABS: 2.0000 | ORAL_TABLET | Freq: Every day | ORAL | 0 refills | Status: AC

## 2019-07-29 MED ORDER — OXYCODONE HCL 5 MG PO TABS
5.0000 mg | ORAL_TABLET | Freq: Once | ORAL | Status: AC
  Administered 2019-07-29: 5 mg via ORAL
  Filled 2019-07-29: qty 1

## 2019-07-29 MED ORDER — IBUPROFEN 600 MG PO TABS: 600.0000 mg | ORAL_TABLET | Freq: Four times a day (QID) | ORAL | 0 refills | Status: AC | PRN

## 2019-07-29 MED ORDER — KETOROLAC TROMETHAMINE 30 MG/ML IJ SOLN
30.0000 mg | Freq: Four times a day (QID) | INTRAMUSCULAR | Status: DC
  Administered 2019-07-29 (×2): 30 mg via INTRAVENOUS
  Filled 2019-07-29 (×2): qty 1

## 2019-07-29 NOTE — Progress Notes (Signed)
Pharmacy Antibiotic Note  Andrew Donaldson is a 39 y.o. male admitted on 07/26/2019 with cellulitis.  Pharmacy has been consulted for vancomycin and cefepime  dosing.    Plan: Continue cefepime 2g IV q8h  Continue vancomycin 1.5g IV q12h Plan for discharge by 6/24 so will not order vanco levels. Pharmacy will continue to monitor renal function, vancomycin troughs as clinically appropriate, cultures and patient progress.  Height: 5\' 9"  (175.3 cm) Weight: 100.2 kg (220 lb 14.4 oz) IBW/kg (Calculated) : 70.7  Temp (24hrs), Avg:98.5 F (36.9 C), Min:98.4 F (36.9 C), Max:98.6 F (37 C)  Recent Labs  Lab 07/26/19 0750 07/27/19 0405 07/28/19 0409  WBC 15.4* 17.5* 15.3*  CREATININE 0.95 0.80 0.80  LATICACIDVEN 0.8  --   --     Estimated Creatinine Clearance: 144.7 mL/min (by C-G formula based on SCr of 0.8 mg/dL).    Allergies  Allergen Reactions  . Penicillins Anaphylaxis    Pt tolerated cefepime on 07/26/19.     Antimicrobials this admission: cefepime 6/20>>  vancomycin  6/20  >>      Microbiology results: 6/20 BC x2: ngtd  6/20  MRSA PCR:  Negative Nose Wound Cx: pending  Thank you for allowing pharmacy to be a part of this patient's care.  7/20 07/29/2019 11:04 AM

## 2019-07-29 NOTE — Discharge Summary (Addendum)
Physician Discharge Summary  Andrew Donaldson WUJ:[id number] DOB: [birth year] DOA: 07/26/2019  [facility]  Admit date: 07/26/2019 Discharge date: 07/29/2019  Admitted From: Home Disposition: Home  Recommendations for Outpatient Follow-up:  Follow up with PCP in 1-2 weeks Please obtain BMP/CBC in one week Follow-up with primary care physician in 1 week  Discharge Condition: Stable CODE STATUS: Full code Diet recommendation: Regular diet  Brief/Interim Summary: 39 year old male with a history of PTSD, tobacco abuse, anxiety, alcohol dependence presenting with facial pain and nasal swelling for 3 to 4 days prior to admission.  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  He stopped to outside hospitals' emergency departments prior to arrival to [facility].  Most recently, the patient was given clindamycin and Percocet.  He taken oral antibiotics for 1 day prior to admission.  He stated that the swelling and pain continue to worsen.  He denied any fevers, chills, nausea, vomiting, diarrhea, abdominal pain, chest pain, shortness of breath. In the emergency department, the patient was afebrile hemodynamically stable with oxygen saturation 94-97% on room air.  WBC initially was 15.4 with hemoglobin 13.0 and platelets 267,000.  CT of the face showed extensive inflammatory changes within the soft tissue component of the nose anteriorly.  There was heterogenous enhancement without discrete abscess.  There is no invasive component evident but inflammatory changes extended to the right periorbital soft tissues.  There is no post septal inflammation.  The patient was started on vancomycin and cefepime.  Discharge Diagnoses:  Active Problems:   PTSD (post-traumatic stress disorder)   Nose cellulitis due to MRSA   Tobacco abuse  Patient was treated with intravenous vancomycin and cefepime.  Sepsis was ruled out. Overall, he felt that swelling and pain around his nose had significantly improved  since admission.  Dr. Margo AyeHall spoke with Dr. Pollyann Kennedyosen on call for ENT and discussed CT findings of facial cellulitis.  Recommendations are for nonoperative management at this time.  Patient was treated with pain medication as well as IV antibiotics.  He felt significantly better after receiving Toradol.  He will be continued on NSAIDs as well as as needed oxycodone.  Since he only completed 1 day of clindamycin prior to admission, will be continued on the same antibiotics since this should provide adequate skin coverage.  Wound culture sent prior to discharge and is pending at the time of discharge.  Blood cultures are showing no growth.  Patient was very insistent on being discharged home today.  Since clinically he feels that he is improving, will plan on discharge home to continue on his prior regimen of clindamycin.  He has been advised to return to the emergency room if he has recurrent fever, worsening pain/swelling or change in mental status.  Discharge Instructions  Discharge Instructions     Diet - low sodium heart healthy   Complete by: As directed    Increase activity slowly   Complete by: As directed       Allergies as of 07/29/2019       Reactions   Penicillins Anaphylaxis   Pt tolerated cefepime on 07/26/19.        Medication List     TAKE these medications    Abilify 20 MG tablet Generic drug: ARIPiprazole Take 10 mg by mouth daily.   amphetamine-dextroamphetamine 20 MG tablet Commonly known as: ADDERALL Take 40 mg by mouth 2 (two) times daily.   busPIRone 10 MG tablet Commonly known as: BUSPAR Take 5 mg by mouth 3 (  three) times daily.   clindamycin 150 MG capsule Commonly known as: CLEOCIN Take 450 mg by mouth 3 (three) times daily.   ibuprofen 600 MG tablet Commonly known as: ADVIL Take 1 tablet (600 mg total) by mouth every 6 (six) hours as needed for moderate pain.   Minipress 2 MG capsule Generic drug: prazosin Take 8 mg by mouth at bedtime.    mirtazapine 30 MG tablet Commonly known as: REMERON Take 30 mg by mouth at bedtime.   oxyCODONE 5 MG immediate release tablet Commonly known as: Oxy IR/ROXICODONE Take 1 tablet (5 mg total) by mouth every 6 (six) hours as needed for severe pain or breakthrough pain.   senna 8.6 MG Tabs tablet Commonly known as: SENOKOT Take 2 tablets (17.2 mg total) by mouth at bedtime.   sildenafil 100 MG tablet Commonly known as: VIAGRA Take 50 mg by mouth daily as needed for erectile dysfunction.   venlafaxine XR 150 MG 24 hr capsule Commonly known as: EFFEXOR-XR Take 150 mg by mouth daily.         Allergies  Allergen Reactions   Penicillins Anaphylaxis    Pt tolerated cefepime on 07/26/19.     Consultations:    Procedures/Studies: CT Maxillofacial W Contrast  Result Date: 07/26/2019 CLINICAL DATA:  Right-sided facial pain.  Infection of the nose. EXAM: CT MAXILLOFACIAL WITH CONTRAST TECHNIQUE: Multidetector CT imaging of the maxillofacial structures was performed with intravenous contrast. Multiplanar CT image reconstructions were also generated. CONTRAST:  4mL OMNIPAQUE IOHEXOL 300 MG/ML  SOLN COMPARISON:  CT of the face 10/10/2017 FINDINGS: Osseous: No acute or focal osseous abnormalities are present. No erosions are evident. Orbits: Right periorbital soft tissue swelling is present. No postseptal inflammatory changes are present. Globes and orbits are otherwise within normal limits. Sinuses: Mucosal thickening is present along the floor of the left maxillary sinus. Remaining paranasal sinuses and mastoid air cells are clear. Soft tissues: Extensive inflammatory changes are present within the soft tissue component is the nose anteriorly. Heterogeneous enhancement pattern is present without a discrete abscess. Inflammatory changes extend into the right periorbital soft tissues, more prominent inferiorly. Stranding extends superiorly to the forehead as well. Asymmetric superficial and  right submandibular lymph nodes are likely reactive. Limited intracranial: Within normal limits. IMPRESSION: 1. Extensive inflammatory changes within the soft tissue component is the nose anteriorly. 2. Heterogeneous enhancement pattern without a discrete abscess. The pattern likely reflects a cellulitis. No invasive components are evident. 3. Inflammatory changes extend into the right periorbital soft tissues, more prominent inferiorly. 4. No postseptal inflammatory changes. 5. Asymmetric superficial and right submandibular lymph nodes are likely reactive. 6. Mucosal thickening along the floor of the left maxillary sinus. Electronically Signed   By: Marin Roberts M.D.   On: 07/26/2019 09:08      Subjective: Patient is feeling better and wants to go home.  Feels that swelling and pain in his nose has improved.  Discharge Exam: Vitals:   07/29/19 0017 07/29/19 0404 07/29/19 1341 07/29/19 1600  BP: (!) 146/102 122/83 (!) 127/92 (!) 141/100  Pulse: 94 83 (!) 114 (!) 117  Resp:  16 18   Temp:  98.4 F (36.9 C) 98.3 F (36.8 C)   TempSrc:  Oral Oral   SpO2: 99% 97% 97%   Weight:      Height:        General: Pt is alert, awake, not in acute distress HEENT: Continued erythema and swelling of nose with purulent draining wounds on bridge  of nose as well as right nare Cardiovascular: RRR, S1/S2 +, no rubs, no gallops Respiratory: CTA bilaterally, no wheezing, no rhonchi Abdominal: Soft, NT, ND, bowel sounds + Extremities: no edema, no cyanosis    The results of significant diagnostics from this hospitalization (including imaging, microbiology, ancillary and laboratory) are listed below for reference.     Microbiology: Recent Results (from the past 240 hour(s))  MRSA PCR Screening     Status: Abnormal   Collection Time: 07/26/19  7:20 AM   Specimen: Nasopharyngeal  Result Value Ref Range Status   MRSA by PCR POSITIVE (A) NEGATIVE Final    Comment:        The GeneXpert MRSA  Assay (FDA approved for NASAL specimens only), is one component of a comprehensive MRSA colonization surveillance program. It is not intended to diagnose MRSA infection nor to guide or monitor treatment for MRSA infections. RESULT CALLED TO, READ BACK BY AND VERIFIED WITH: CARDWELL L. @ [id number] BY HENDERSON L. Performed at [facility], [address] [place], [place] [address]   Blood culture (routine x 2)     Status: None (Preliminary result)   Collection Time: 07/26/19  7:50 AM   Specimen: Left Antecubital; Blood  Result Value Ref Range Status   Specimen Description   Final    LEFT ANTECUBITAL BOTTLES DRAWN AEROBIC AND ANAEROBIC   Special Requests Blood Culture adequate volume  Final   Culture   Final    NO GROWTH 3 DAYS Performed at [facility], [address] [place], [place] [address]    Report Status PENDING  Incomplete  Blood culture (routine x 2)     Status: None (Preliminary result)   Collection Time: 07/26/19  7:51 AM   Specimen: Right Antecubital; Blood  Result Value Ref Range Status   Specimen Description   Final    RIGHT ANTECUBITAL BOTTLES DRAWN AEROBIC AND ANAEROBIC   Special Requests Blood Culture adequate volume  Final   Culture   Final    NO GROWTH 3 DAYS Performed at [facility], [address] [place], [place] [address]    Report Status PENDING  Incomplete  SARS Coronavirus 2 by RT PCR (hospital order, performed in [place] hospital lab) Nasopharyngeal Nasopharyngeal Swab     Status: None   Collection Time: 07/26/19 11:04 AM   Specimen: Nasopharyngeal Swab  Result Value Ref Range Status   SARS Coronavirus 2 NEGATIVE NEGATIVE Final    Comment: (NOTE) SARS-CoV-2 target nucleic acids are NOT DETECTED.  The SARS-CoV-2 RNA is generally detectable in upper and lower respiratory specimens during the acute phase of infection. The lowest concentration of SARS-CoV-2 viral copies this assay can detect is 250 copies / mL. A negative result does not  preclude SARS-CoV-2 infection and should not be used as the sole basis for treatment or other patient management decisions.  A negative result may occur with improper specimen collection / handling, submission of specimen other than nasopharyngeal swab, presence of viral mutation(s) within the areas targeted by this assay, and inadequate number of viral copies (<250 copies / mL). A negative result must be combined with clinical observations, patient history, and epidemiological information.  Fact Sheet for Patients:   StrictlyIdeas.no  Fact Sheet for Healthcare Providers: BankingDealers.co.za  This test is not yet approved or  cleared by the [place] FDA and has been authorized for detection and/or diagnosis of SARS-CoV-2 by FDA under an Emergency Use Authorization (EUA).  This EUA will remain in  effect (meaning this test can be used) for the duration of the COVID-19 declaration under Section 564(b)(1) of the Act, 21 U.S.C. section 360bbb-3(b)(1), unless the authorization is terminated or revoked sooner.  Performed at [facility], [address], [place], [place] [address]      Labs: BNP (last 3 results) No results for input(s): BNP in the last 8760 hours. Basic Metabolic Panel: Recent Labs  Lab 07/26/19 0750 07/27/19 0405 07/28/19 0409  NA 137 136 138  K 4.0 4.4 3.8  CL 100 101 101  CO2 25 26 26   GLUCOSE 92 199* 217*  BUN 8 10 10   CREATININE 0.95 0.80 0.80  CALCIUM 9.1 9.1 8.7*  MG  --  2.1  --   PHOS  --  1.8*  --    Liver Function Tests: Recent Labs  Lab 07/27/19 0405  AST 13*  ALT 16  ALKPHOS 85  BILITOT 0.5  PROT 6.8  ALBUMIN 3.0*   No results for input(s): LIPASE, AMYLASE in the last 168 hours. No results for input(s): AMMONIA in the last 168 hours. CBC: Recent Labs  Lab 07/26/19 0750 07/27/19 0405 07/28/19 0409  WBC 15.4* 17.5* 15.3*  NEUTROABS 10.4* 15.2*  --   HGB 13.0 12.3* 12.7*  HCT  38.6* 37.3* 38.7*  MCV 96.7 98.4 98.5  PLT 367 415* 444*   Cardiac Enzymes: No results for input(s): CKTOTAL, CKMB, CKMBINDEX, TROPONINI in the last 168 hours. BNP: Invalid input(s): POCBNP CBG: Recent Labs  Lab 07/28/19 2145  GLUCAP 109*   D-Dimer No results for input(s): DDIMER in the last 72 hours. Hgb A1c No results for input(s): HGBA1C in the last 72 hours. Lipid Profile No results for input(s): CHOL, HDL, LDLCALC, TRIG, CHOLHDL, LDLDIRECT in the last 72 hours. Thyroid function studies No results for input(s): TSH, T4TOTAL, T3FREE, THYROIDAB in the last 72 hours.  Invalid input(s): FREET3 Anemia work up No results for input(s): VITAMINB12, FOLATE, FERRITIN, TIBC, IRON, RETICCTPCT in the last 72 hours. Urinalysis    Component Value Date/Time   COLORURINE YELLOW 07/16/2017 1704   APPEARANCEUR HAZY (A) 07/16/2017 1704   LABSPEC >1.046 (H) 07/16/2017 1704   PHURINE 7.0 07/16/2017 1704   GLUCOSEU NEGATIVE 07/16/2017 1704   HGBUR NEGATIVE 07/16/2017 1704   BILIRUBINUR NEGATIVE 07/16/2017 1704   KETONESUR NEGATIVE 07/16/2017 1704   PROTEINUR NEGATIVE 07/16/2017 1704   UROBILINOGEN 0.2 04/21/2014 0021   NITRITE NEGATIVE 07/16/2017 1704   LEUKOCYTESUR NEGATIVE 07/16/2017 1704   Sepsis Labs Invalid input(s): PROCALCITONIN,  WBC,  LACTICIDVEN Microbiology Recent Results (from the past 240 hour(s))  MRSA PCR Screening     Status: Abnormal   Collection Time: 07/26/19  7:20 AM   Specimen: Nasopharyngeal  Result Value Ref Range Status   MRSA by PCR POSITIVE (A) NEGATIVE Final    Comment:        The GeneXpert MRSA Assay (FDA approved for NASAL specimens only), is one component of a comprehensive MRSA colonization surveillance program. It is not intended to diagnose MRSA infection nor to guide or monitor treatment for MRSA infections. RESULT CALLED TO, READ BACK BY AND VERIFIED WITH: CARDWELL L. @ 1047 ON 09/15/2017 BY HENDERSON L. Performed at [facility], [address], [place], [address] [place]   Blood culture (routine x 2)     Status: None (Preliminary result)   Collection Time: 07/26/19  7:50 AM   Specimen: Left Antecubital; Blood  Result Value Ref Range Status   Specimen Description   Final  LEFT ANTECUBITAL BOTTLES DRAWN AEROBIC AND ANAEROBIC   Special Requests Blood Culture adequate volume  Final   Culture   Final    NO GROWTH 3 DAYS Performed at [facility], [address], [place], [place] [address]    Report Status PENDING  Incomplete  Blood culture (routine x 2)     Status: None (Preliminary result)   Collection Time: 07/26/19  7:51 AM   Specimen: Right Antecubital; Blood  Result Value Ref Range Status   Specimen Description   Final    RIGHT ANTECUBITAL BOTTLES DRAWN AEROBIC AND ANAEROBIC   Special Requests Blood Culture adequate volume  Final   Culture   Final    NO GROWTH 3 DAYS Performed at [facility], [address] [place], [place] [address]    Report Status PENDING  Incomplete  SARS Coronavirus 2 by RT PCR (hospital order, performed in [facility] hospital lab) Nasopharyngeal Nasopharyngeal Swab     Status: None   Collection Time: 07/26/19 11:04 AM   Specimen: Nasopharyngeal Swab  Result Value Ref Range Status   SARS Coronavirus 2 NEGATIVE NEGATIVE Final    Comment: (NOTE) SARS-CoV-2 target nucleic acids are NOT DETECTED.  The SARS-CoV-2 RNA is generally detectable in upper and lower respiratory specimens during the acute phase of infection. The lowest concentration of SARS-CoV-2 viral copies this assay can detect is 250 copies / mL. A negative result does not preclude SARS-CoV-2 infection and should not be used as the sole basis for treatment or other patient management decisions.  A negative result may occur with improper specimen collection / handling, submission of specimen other than nasopharyngeal swab, presence of viral mutation(s) within the areas targeted by this assay, and inadequate number of viral  copies (<250 copies / mL). A negative result must be combined with clinical observations, patient history, and epidemiological information.  Fact Sheet for Patients:   BoilerBrush.com.cy  Fact Sheet for Healthcare Providers: https://pope.com/  This test is not yet approved or  cleared by the [place] FDA and has been authorized for detection and/or diagnosis of SARS-CoV-2 by FDA under an Emergency Use Authorization (EUA).  This EUA will remain in effect (meaning this test can be used) for the duration of the COVID-19 declaration under Section 564(b)(1) of the Act, 21 U.S.C. section 360bbb-3(b)(1), unless the authorization is terminated or revoked sooner.  Performed at [facility], [address], [place], [place] [address]      Time coordinating discharge:  SIGNED:   Erick Blinks, MD  [facility] 07/29/2019, 8:56 PM   If 7PM-7AM, please contact night-coverage [email or web address]

## 2019-07-30 NOTE — Progress Notes (Signed)
   07/29/19 1341  Assess: MEWS Score  Temp 98.3 F (36.8 C)  BP (!) 127/92  Pulse Rate (!) 114  Resp 18  SpO2 97 %  Assess: MEWS Score  MEWS Temp 0  MEWS Systolic 0  MEWS Pulse 2  MEWS RR 0  MEWS LOC 0  MEWS Score 2  MEWS Score Color Yellow  Assess: if the MEWS score is Yellow or Red  Were vital signs taken at a resting state? No  Focused Assessment Documented focused assessment  Early Detection of Sepsis Score *See Row Information* Low  MEWS guidelines implemented *See Row Information* Yes

## 2019-07-31 ENCOUNTER — Other Ambulatory Visit: Payer: Self-pay | Admitting: Internal Medicine

## 2019-07-31 LAB — CULTURE, BLOOD (ROUTINE X 2)
Culture: NO GROWTH
Culture: NO GROWTH
Special Requests: ADEQUATE
Special Requests: ADEQUATE

## 2019-07-31 LAB — AEROBIC CULTURE W GRAM STAIN (SUPERFICIAL SPECIMEN)

## 2019-07-31 MED ORDER — DOXYCYCLINE HYCLATE 100 MG PO TABS: 100.0000 mg | ORAL_TABLET | Freq: Two times a day (BID) | ORAL | 0 refills | Status: AC

## 2019-07-31 NOTE — Progress Notes (Addendum)
6/25 11:00A:  Patient discharged from [facility] on 6/23 with cellulitis of nose with wound. He was discharged on oral clindamycin. Wound culture results returned positive today for MRSA, resistant to Clindamycin. Based on sensitivities, a new prescription for doxycycline was called into his pharmacy. Patient was informed over the phone regarding change in antibiotic. He will go to pharmacy later today to pick up the new prescription.  [employer]

## 2019-08-04 ENCOUNTER — Other Ambulatory Visit: Payer: Self-pay

## 2019-08-04 ENCOUNTER — Ambulatory Visit
Admission: EM | Admit: 2019-08-04 | Discharge: 2019-08-04 | Disposition: A | Discharge status: Home / Self Care | Payer: No Typology Code available for payment source | Attending: Emergency Medicine | Admitting: Emergency Medicine

## 2019-08-04 ENCOUNTER — Encounter: Payer: Self-pay | Admitting: Emergency Medicine

## 2019-08-04 DIAGNOSIS — T7840XA Allergy, unspecified, initial encounter: Secondary | ICD-10-CM

## 2019-08-04 MED ORDER — TETRACYCLINE HCL 250 MG PO CAPS
250.0000 mg | ORAL_CAPSULE | Freq: Four times a day (QID) | ORAL | 0 refills | Status: DC
Start: 2019-08-04 — End: 2019-08-04

## 2019-08-04 MED ORDER — VANCOMYCIN HCL 125 MG PO CAPS: 125.0000 mg | ORAL_CAPSULE | Freq: Four times a day (QID) | ORAL | 0 refills | Status: AC

## 2019-08-04 NOTE — Discharge Instructions (Addendum)
Tetracycline was prescribed/take as directed Use OTC Zyrtec or Benadryl as needed for allergy reaction Follow-up with PCP Return or go to ED for worsening of symptoms

## 2019-08-04 NOTE — ED Triage Notes (Signed)
Started doxycycline 06/25 approx 2 days later pt reports having pain to the soles of his feet, swollen tongue w/ sensitivity and eyes swelling.  Pt pcp told him to come to urgent care for poss allergic reaction to abx.
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
## 2019-09-26 IMAGING — MR MR CERVICAL SPINE WO/W CM
4 of 8 series · 16 of 48 positions shown · IV contrast (multihance)
Comparison: None.

CLINICAL DATA: Demyelinating disease with spinal cord symptoms

EXAM:
MRI CERVICAL SPINE WITHOUT AND WITH CONTRAST
TECHNIQUE: Multiplanar and multiecho pulse sequences of the cervical spine, to
include the craniocervical junction and cervicothoracic junction,
were obtained without and with intravenous contrast.
CONTRAST:  18mL MULTIHANCE GADOBENATE DIMEGLUMINE 529 MG/ML IV SOLN

[Series 6: T2 · axial · 3.0mm · 0.19mm/px · z∈[-15,+81]mm · 7 of 36 slices shown (1 of 2)]
[im 1/36]
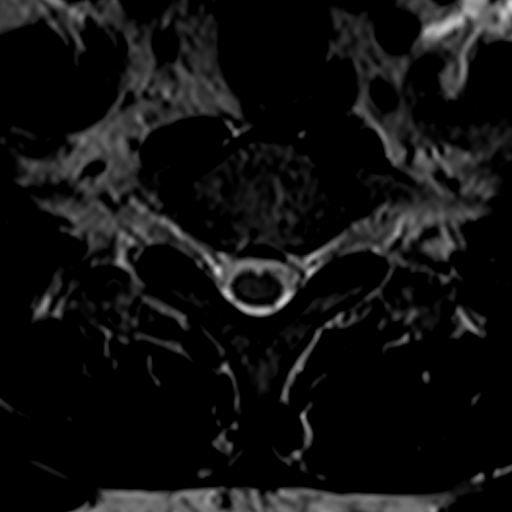
[im 6/36]
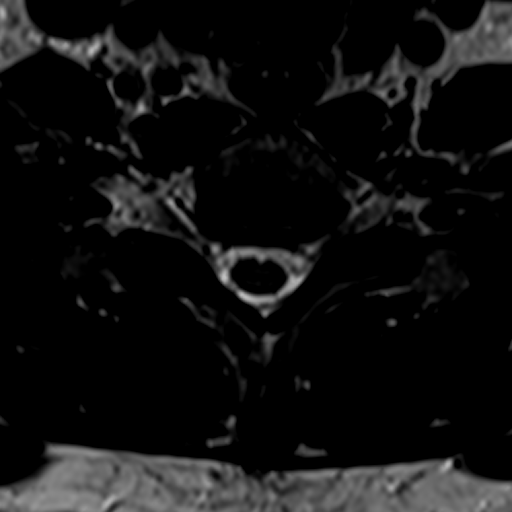
[im 11/36]
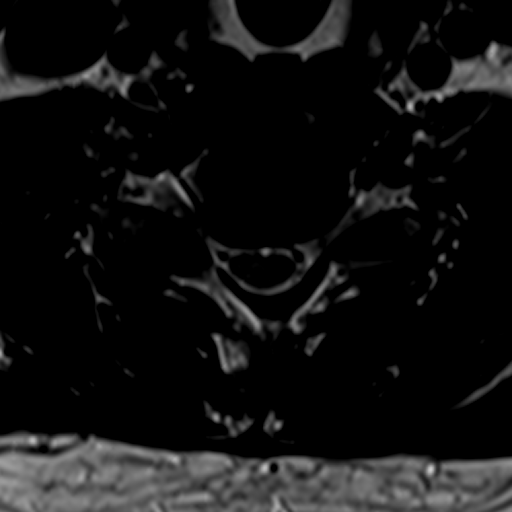
[im 16/36]
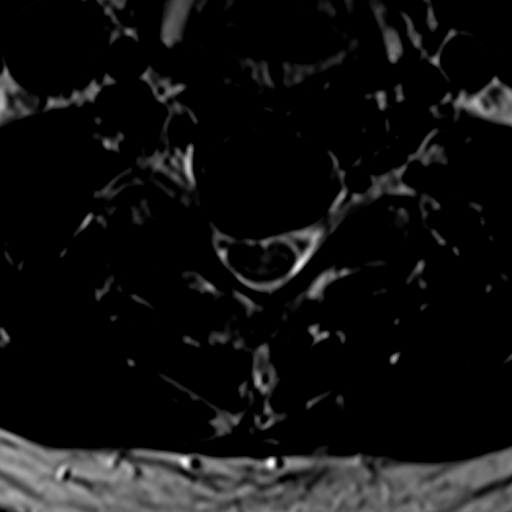
[im 21/36]
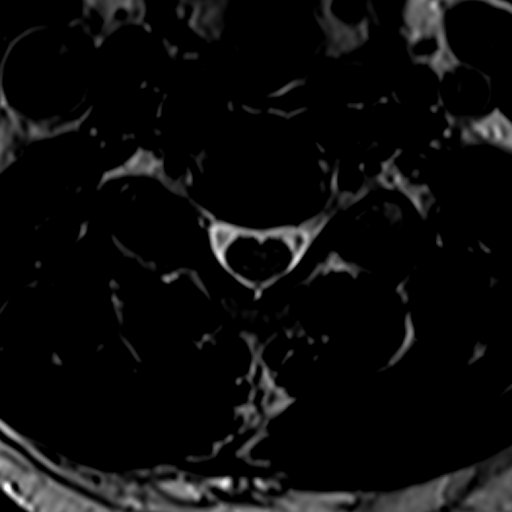
[im 26/36]
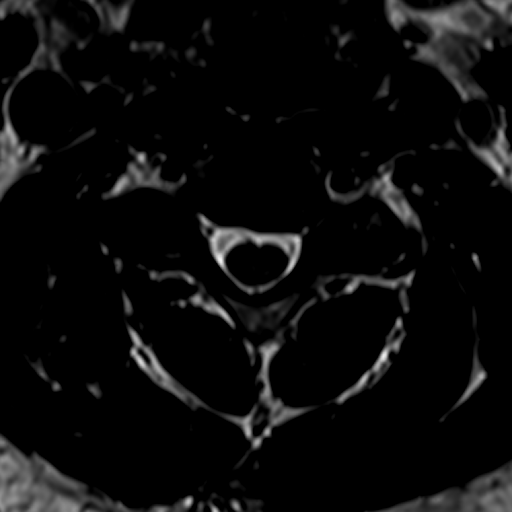
[im 31/36]
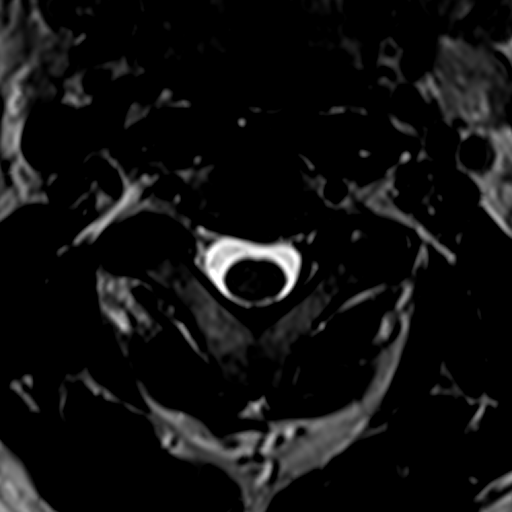

[Series 7: T1 · axial · 3.0mm · 0.14mm/px · z∈[+2,+82]mm · 3 of 36 slices shown (1 of 2)]
[im 6/36]
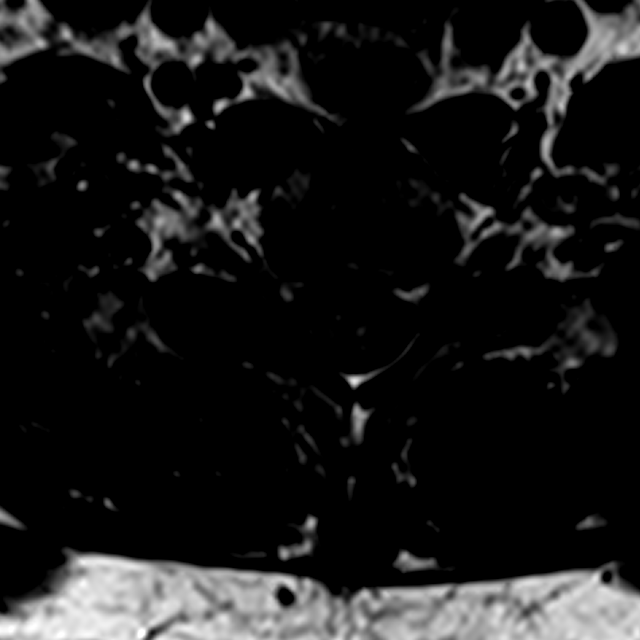
[im 21/36]
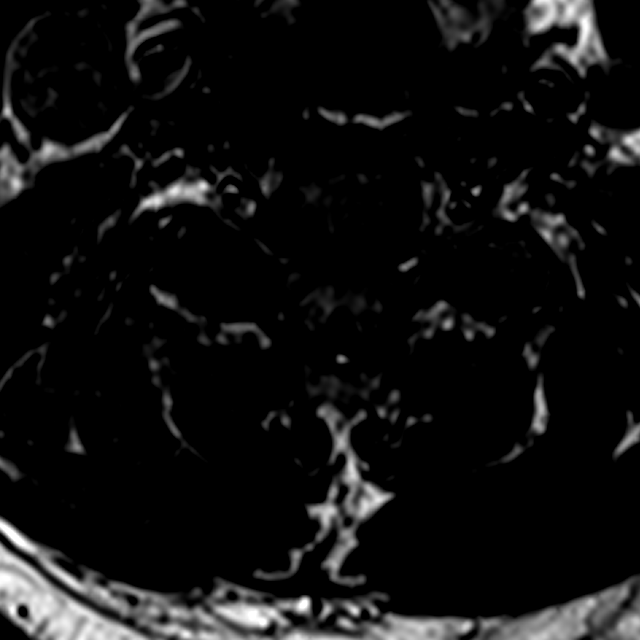
[im 31/36]
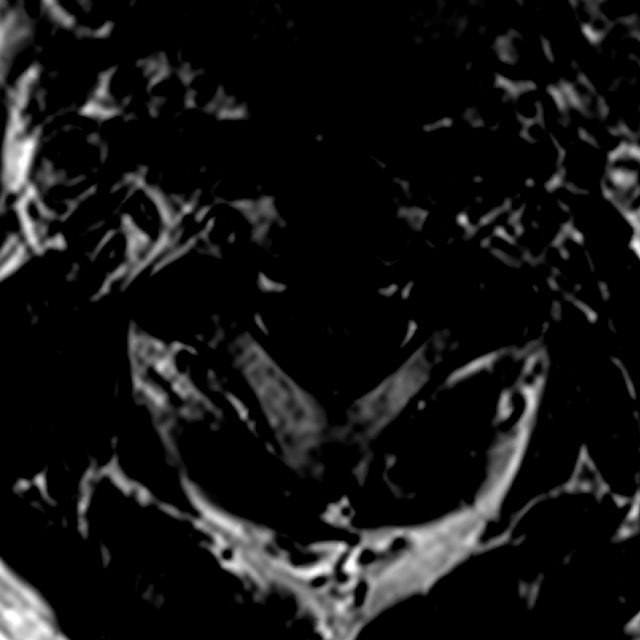

[Series 8: T2 · sagittal · 3.0mm · 0.47mm/px · 3 of 15 slices shown (2 of 2)]
[im 1/15]
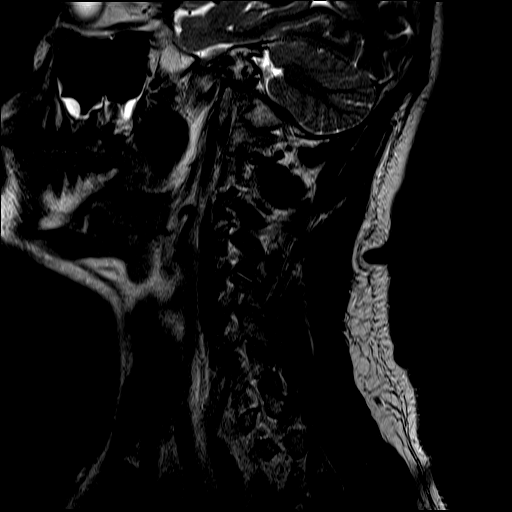
[im 10/15]
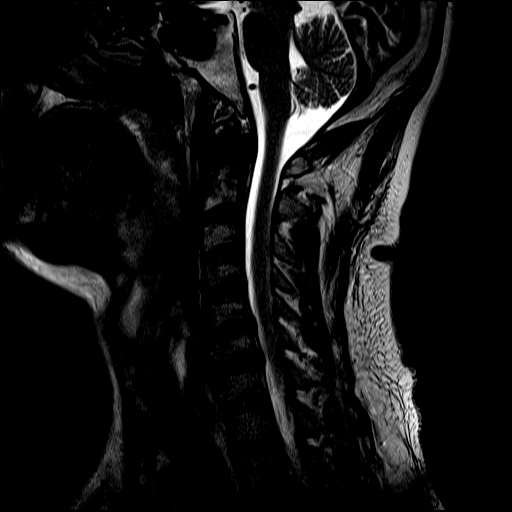
[im 15/15]
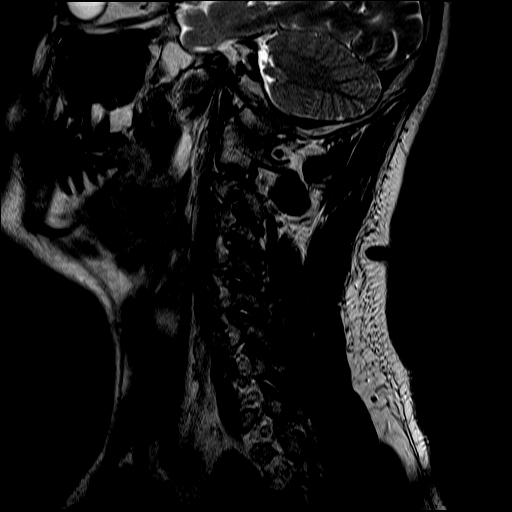

[Series 10: T1 · axial · 3.0mm · 0.14mm/px · z∈[+2,+82]mm · 3 of 36 slices shown (2 of 2)]
[im 6/36]
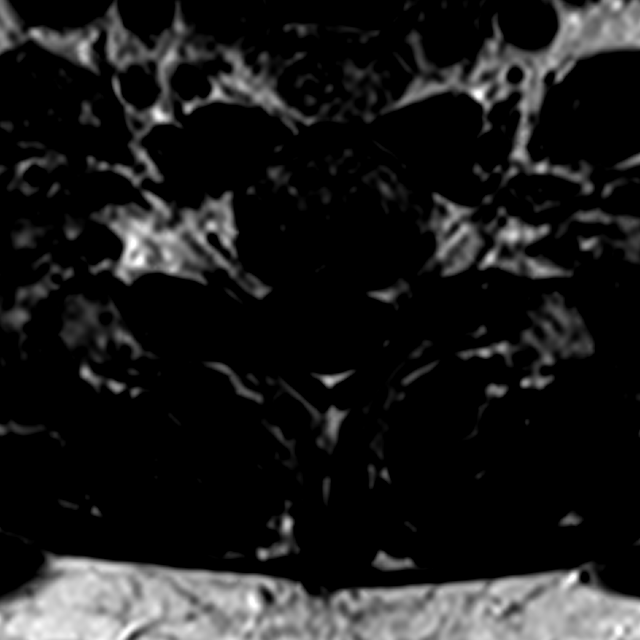
[im 21/36]
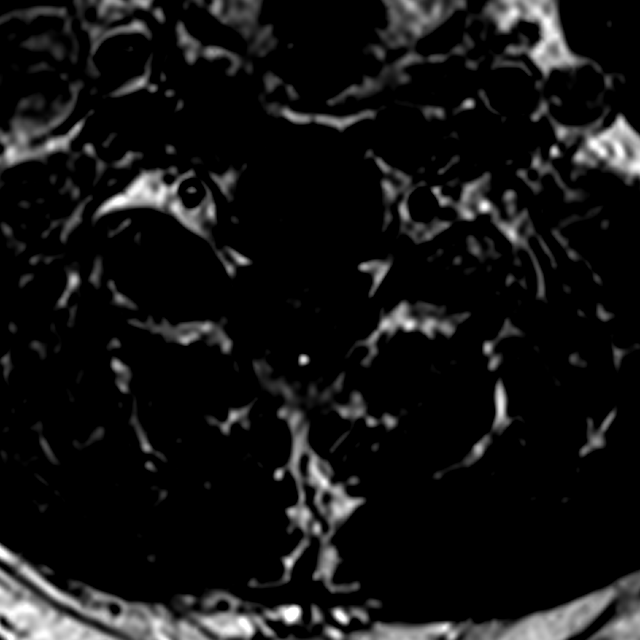
[im 31/36]
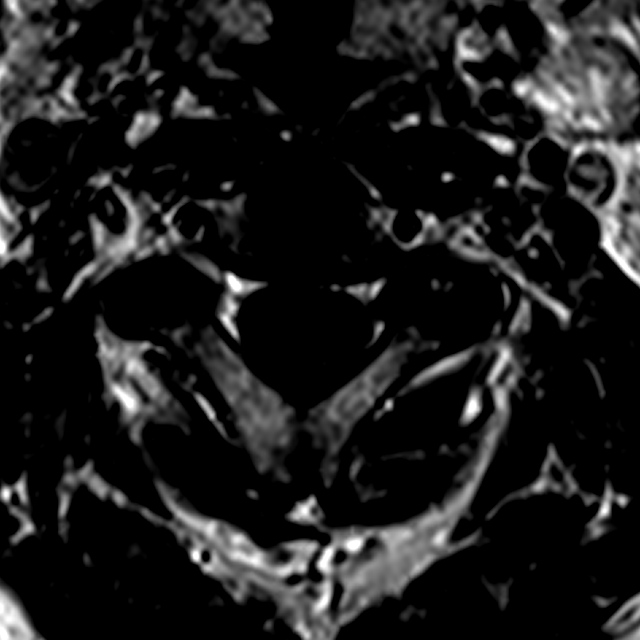

[16 of 48 positions shown; findings below may reference images not displayed]

FINDINGS: Alignment: Normal

Vertebrae: Normal

Cord: Normal signal and morphology. No cord lesion. Normal spinal
cord enhancement

Posterior Fossa, vertebral arteries, paraspinal tissues: Negative

Disc levels:

C2-3: Mild facet degeneration

C3-4: Mild facet degeneration

C4-5: Moderate facet degeneration. Mild foraminal encroachment
bilaterally. Spinal canal adequate in size

C5-6: Shallow right paracentral disc protrusion and spurring appears
chronic. Moderate facet hypertrophy bilaterally. Moderate right
foraminal encroachment

C6-7: Shallow right-sided disc protrusion and spurring. Mild spinal
stenosis and mild right foraminal encroachment

C7-T1: Negative
IMPRESSION: Normal appearing spinal cord

Multilevel degenerative changes as above.

## 2019-09-26 IMAGING — MR MR HEAD W/ CM
3 series · 21 of 48 positions shown · IV contrast (18ml Multihance)
Comparison: MRI head without contrast 07/17/2017

CLINICAL DATA: Multiple sclerosis.  Left-sided weakness

EXAM:
MRI HEAD WITH CONTRAST
TECHNIQUE: Multiplanar, multiecho pulse sequences of the brain and surrounding
structures were obtained with intravenous contrast.
CONTRAST:  18mL MULTIHANCE GADOBENATE DIMEGLUMINE 529 MG/ML IV SOLN

[Series 2: T1 post-contrast · axial · 2.0mm · 0.43mm/px · z∈[+127,+283]mm · 9 of 84 slices shown (1 of 3)]
[im 3/84]
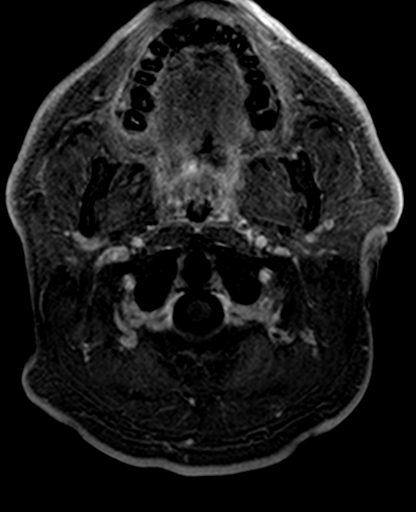
[im 14/84]
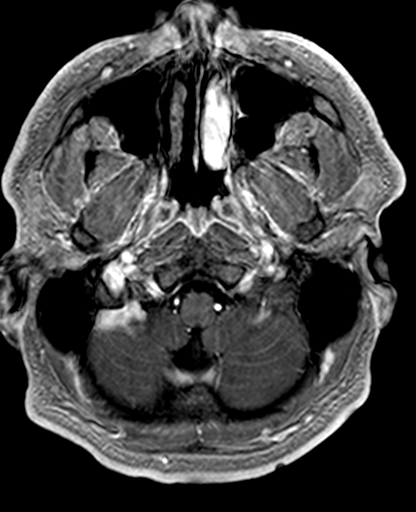
[im 25/84]
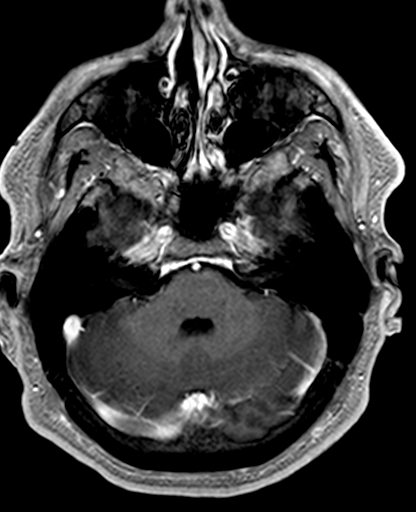
[im 36/84]
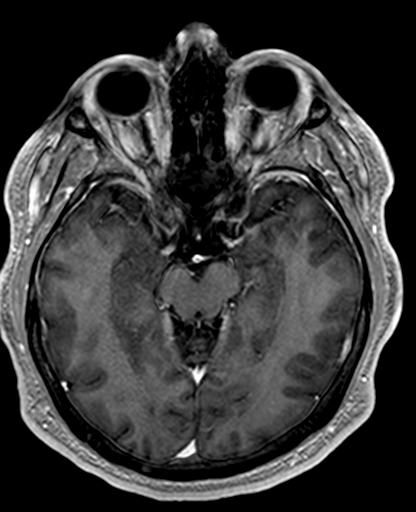
[im 42/84]
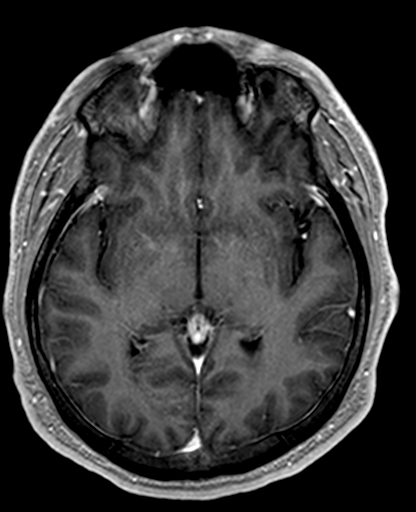
[im 48/84]
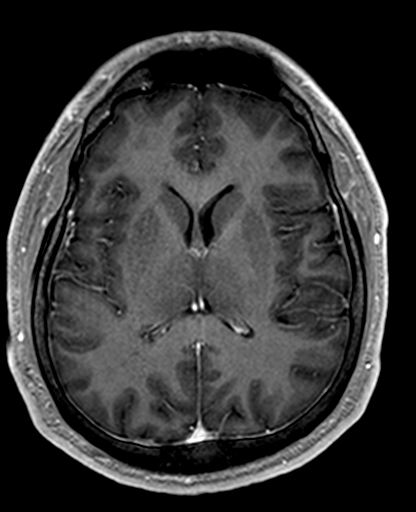
[im 59/84]
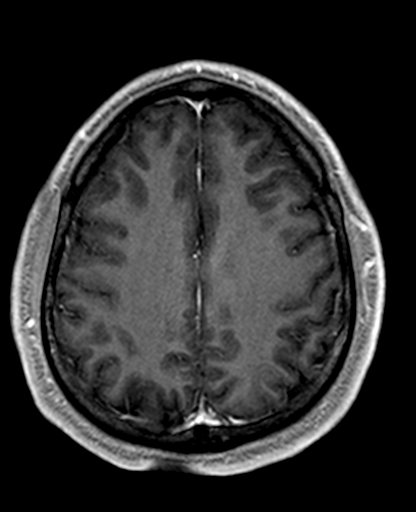
[im 70/84]
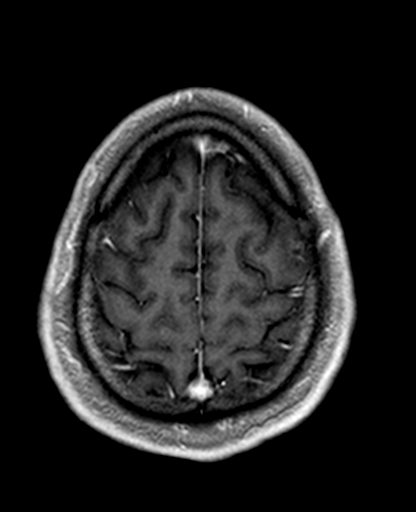
[im 81/84]
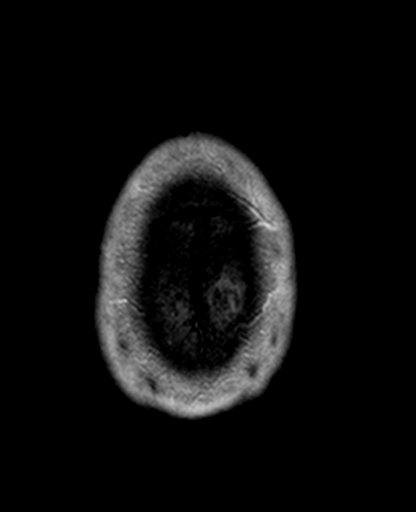

[Series 3: T1 post-contrast · coronal · 5.0mm · 0.39mm/px · 8 of 28 slices shown (2 of 3)]
[im 1/28]
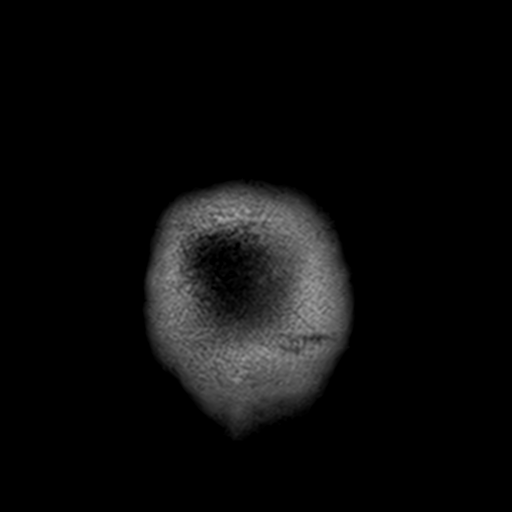
[im 4/28]
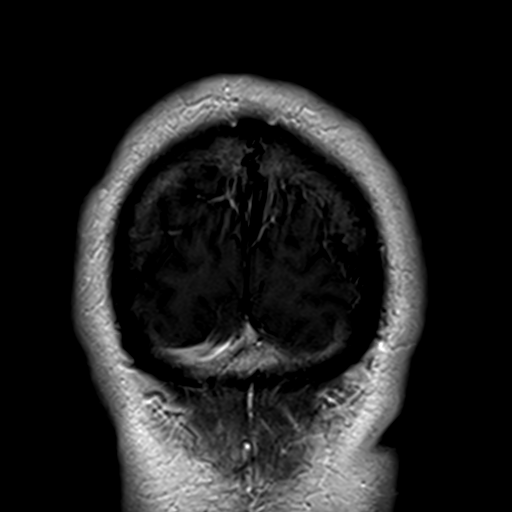
[im 10/28]
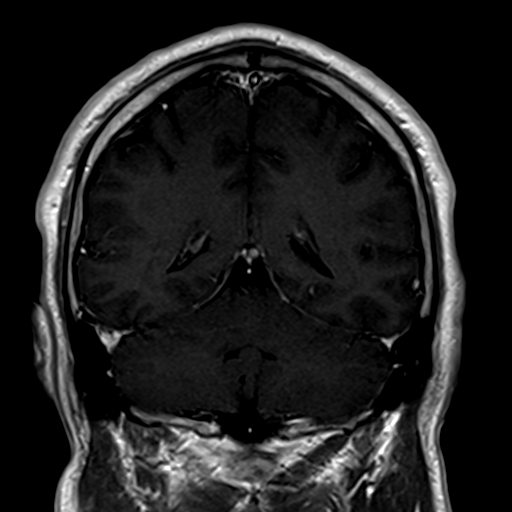
[im 13/28]
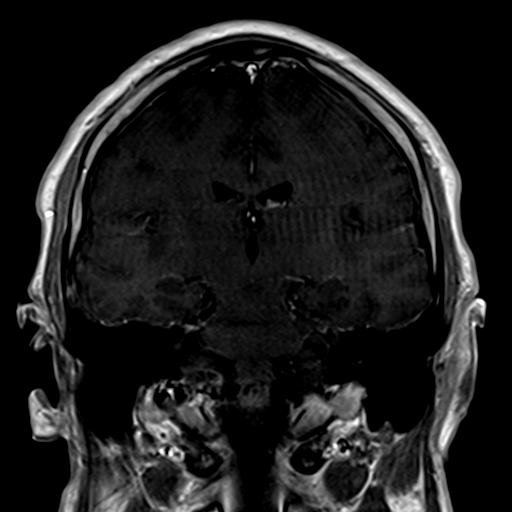
[im 16/28]
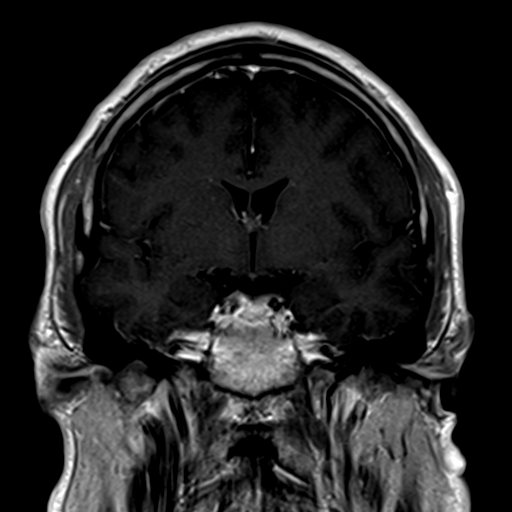
[im 19/28]
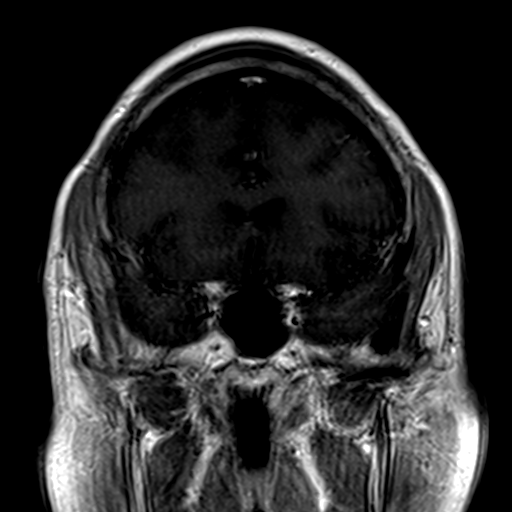
[im 25/28]
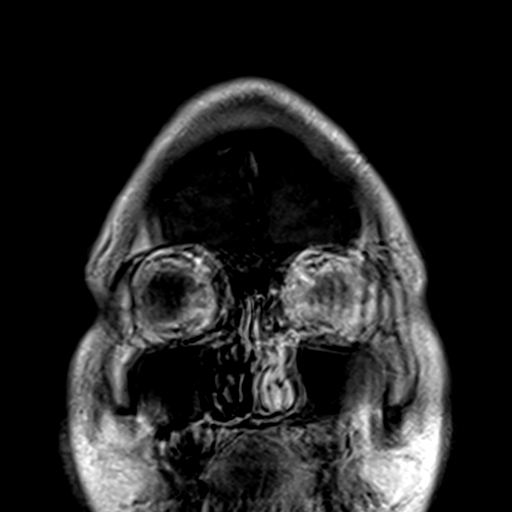
[im 28/28]
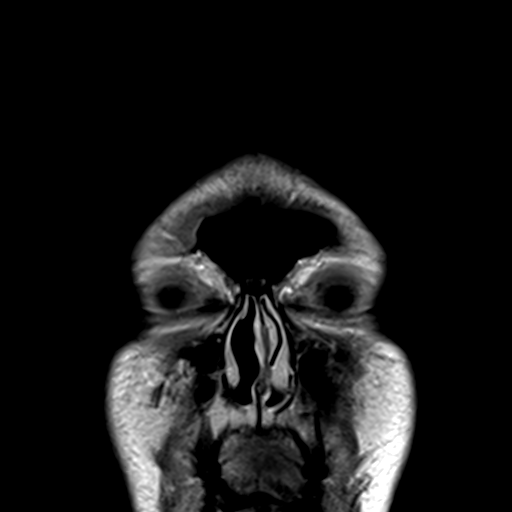

[Series 4: T1 post-contrast · sagittal · 5.0mm · 0.40mm/px · 4 of 20 slices shown (3 of 3)]
[im 1/20]
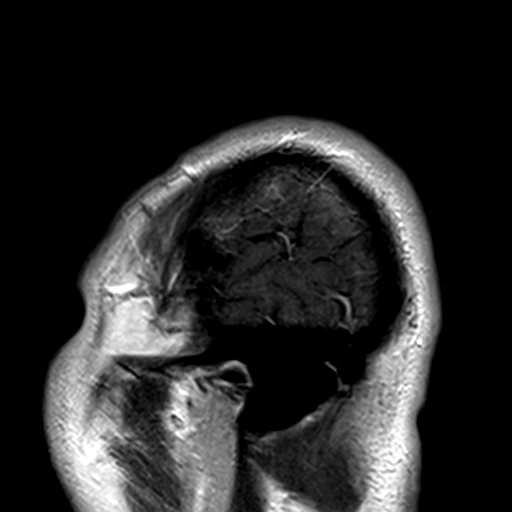
[im 4/20]
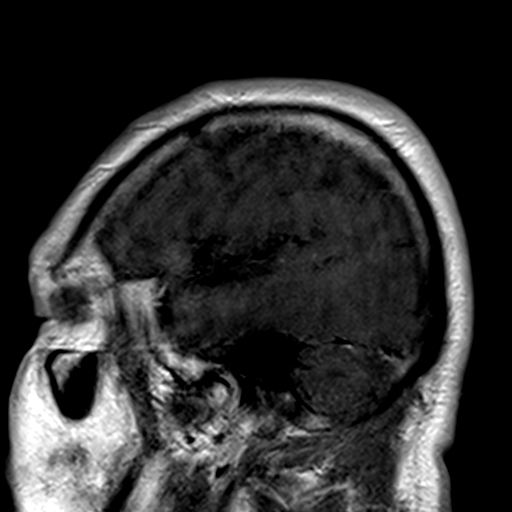
[im 10/20]
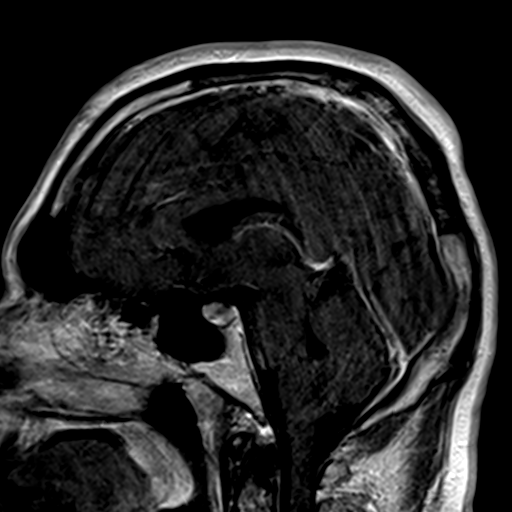
[im 16/20]
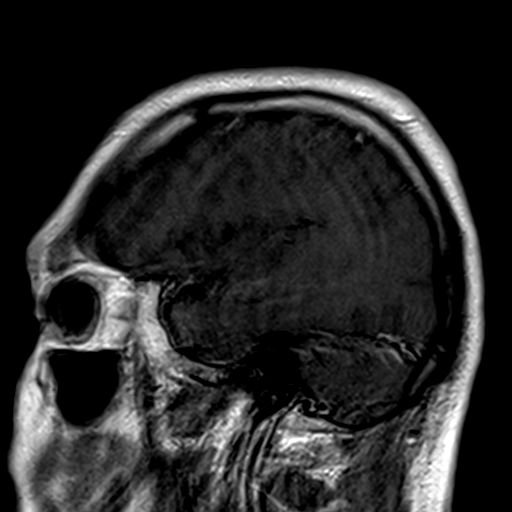

[21 of 48 positions shown; findings below may reference images not displayed]

FINDINGS: Image quality degraded by mild motion

Normal enhancement pattern. No enhancing white matter lesions. No
mass lesion. Leptomeningeal enhancement normal

Ventricle size normal.  No midline shift.
IMPRESSION: Normal enhancement postcontrast administration. See unenhanced MRI
head results from yesterday.

## 2020-04-12 ENCOUNTER — Emergency Department (HOSPITAL_COMMUNITY)
Admission: EM | Admit: 2020-04-12 | Discharge: 2020-04-12 | Disposition: A | Discharge status: Home / Self Care | Payer: No Typology Code available for payment source | Attending: Emergency Medicine | Admitting: Emergency Medicine

## 2020-04-12 ENCOUNTER — Encounter (HOSPITAL_COMMUNITY): Payer: Self-pay | Admitting: Emergency Medicine

## 2020-04-12 ENCOUNTER — Other Ambulatory Visit: Payer: Self-pay

## 2020-04-12 ENCOUNTER — Emergency Department (HOSPITAL_COMMUNITY): Payer: No Typology Code available for payment source

## 2020-04-12 DIAGNOSIS — F17210 Nicotine dependence, cigarettes, uncomplicated: Secondary | ICD-10-CM | POA: Insufficient documentation

## 2020-04-12 DIAGNOSIS — K920 Hematemesis: Secondary | ICD-10-CM | POA: Diagnosis not present

## 2020-04-12 DIAGNOSIS — Z79899 Other long term (current) drug therapy: Secondary | ICD-10-CM | POA: Insufficient documentation

## 2020-04-12 DIAGNOSIS — I10 Essential (primary) hypertension: Secondary | ICD-10-CM | POA: Diagnosis not present

## 2020-04-12 DIAGNOSIS — R1084 Generalized abdominal pain: Secondary | ICD-10-CM | POA: Insufficient documentation

## 2020-04-12 DIAGNOSIS — N179 Acute kidney failure, unspecified: Secondary | ICD-10-CM

## 2020-04-12 LAB — CBC WITH DIFFERENTIAL/PLATELET
Abs Immature Granulocytes: 0.05 10*3/uL (ref 0.00–0.07)
Basophils Absolute: 0 10*3/uL (ref 0.0–0.1)
Basophils Relative: 0 %
Eosinophils Absolute: 0.1 10*3/uL (ref 0.0–0.5)
Eosinophils Relative: 1 %
HCT: 47 % (ref 39.0–52.0)
Hemoglobin: 17.1 g/dL — ABNORMAL HIGH (ref 13.0–17.0)
Immature Granulocytes: 0 %
Lymphocytes Relative: 21 %
Lymphs Abs: 2.8 10*3/uL (ref 0.7–4.0)
MCH: 33.6 pg (ref 26.0–34.0)
MCHC: 36.4 g/dL — ABNORMAL HIGH (ref 30.0–36.0)
MCV: 92.3 fL (ref 80.0–100.0)
Monocytes Absolute: 1.8 10*3/uL — ABNORMAL HIGH (ref 0.1–1.0)
Monocytes Relative: 13 %
Neutro Abs: 8.7 10*3/uL — ABNORMAL HIGH (ref 1.7–7.7)
Neutrophils Relative %: 65 %
Platelets: 365 10*3/uL (ref 150–400)
RBC: 5.09 MIL/uL (ref 4.22–5.81)
RDW: 13.3 % (ref 11.5–15.5)
WBC: 13.4 10*3/uL — ABNORMAL HIGH (ref 4.0–10.5)
nRBC: 0 % (ref 0.0–0.2)

## 2020-04-12 LAB — COMPREHENSIVE METABOLIC PANEL
ALT: 101 U/L — ABNORMAL HIGH (ref 0–44)
AST: 368 U/L — ABNORMAL HIGH (ref 15–41)
Albumin: 5.3 g/dL — ABNORMAL HIGH (ref 3.5–5.0)
Alkaline Phosphatase: 79 U/L (ref 38–126)
Anion gap: 15 (ref 5–15)
BUN: 44 mg/dL — ABNORMAL HIGH (ref 6–20)
CO2: 24 mmol/L (ref 22–32)
Calcium: 10 mg/dL (ref 8.9–10.3)
Chloride: 94 mmol/L — ABNORMAL LOW (ref 98–111)
Creatinine, Ser: 2.25 mg/dL — ABNORMAL HIGH (ref 0.61–1.24)
GFR, Estimated: 37 mL/min — ABNORMAL LOW (ref >60)
Glucose, Bld: 131 mg/dL — ABNORMAL HIGH (ref 70–99)
Potassium: 3.7 mmol/L (ref 3.5–5.1)
Sodium: 133 mmol/L — ABNORMAL LOW (ref 135–145)
Total Bilirubin: 1.5 mg/dL — ABNORMAL HIGH (ref 0.3–1.2)
Total Protein: 8.9 g/dL — ABNORMAL HIGH (ref 6.5–8.1)

## 2020-04-12 LAB — LIPASE, BLOOD: Lipase: 20 U/L (ref 11–51)

## 2020-04-12 MED ORDER — SODIUM CHLORIDE 0.9 % IV BOLUS
1000.0000 mL | Freq: Once | INTRAVENOUS | Status: AC
  Administered 2020-04-12: 1000 mL via INTRAVENOUS

## 2020-04-12 MED ORDER — LORAZEPAM 2 MG/ML IJ SOLN
2.0000 mg | Freq: Once | INTRAMUSCULAR | Status: AC
  Administered 2020-04-12: 2 mg via INTRAVENOUS
  Filled 2020-04-12: qty 1

## 2020-04-12 MED ORDER — HYDROMORPHONE HCL 1 MG/ML IJ SOLN
1.0000 mg | Freq: Once | INTRAMUSCULAR | Status: AC
  Administered 2020-04-12: 1 mg via INTRAVENOUS
  Filled 2020-04-12: qty 1

## 2020-04-12 MED ORDER — MORPHINE SULFATE (PF) 4 MG/ML IV SOLN
4.0000 mg | Freq: Once | INTRAVENOUS | Status: AC
  Administered 2020-04-12: 4 mg via INTRAVENOUS
  Filled 2020-04-12: qty 1

## 2020-04-12 MED ORDER — ONDANSETRON HCL 4 MG/2ML IJ SOLN
4.0000 mg | Freq: Once | INTRAMUSCULAR | Status: AC
  Administered 2020-04-12: 4 mg via INTRAVENOUS
  Filled 2020-04-12: qty 2
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
## 2020-04-12 NOTE — ED Triage Notes (Signed)
Pt c/o abd pain since yesterday about noon. Tonight pt began throwing up blood. Pt extremely anxious at this time.
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
## 2020-04-14 ENCOUNTER — Telehealth: Payer: Self-pay | Admitting: *Deleted

## 2020-04-14 NOTE — Telephone Encounter (Signed)
Pt called and wanted to be seen today for PTSD. Pt has not been seen here in over 10 years. Pt was very upset and states that the hospitals will not give him the medicine that he needs. Advised the pt to go to the ED now, or call the police or 911.

## 2023-04-02 ENCOUNTER — Encounter (HOSPITAL_COMMUNITY): Payer: Self-pay | Admitting: Internal Medicine
